# Patient Record
Sex: Female | Born: 1935 | Race: White | Hispanic: No | Smoking: Never smoker
Health system: Southern US, Community
[De-identification: ages and names within clinical notes are randomized; demographics above are authoritative.]

## PROBLEM LIST (undated history)

## (undated) DIAGNOSIS — F329 Major depressive disorder, single episode, unspecified: Secondary | ICD-10-CM

## (undated) DIAGNOSIS — G8929 Other chronic pain: Secondary | ICD-10-CM

## (undated) DIAGNOSIS — F419 Anxiety disorder, unspecified: Secondary | ICD-10-CM

## (undated) DIAGNOSIS — I10 Essential (primary) hypertension: Secondary | ICD-10-CM

## (undated) DIAGNOSIS — Z8719 Personal history of other diseases of the digestive system: Secondary | ICD-10-CM

## (undated) DIAGNOSIS — K219 Gastro-esophageal reflux disease without esophagitis: Secondary | ICD-10-CM

## (undated) DIAGNOSIS — J449 Chronic obstructive pulmonary disease, unspecified: Secondary | ICD-10-CM

## (undated) DIAGNOSIS — L03116 Cellulitis of left lower limb: Secondary | ICD-10-CM

## (undated) DIAGNOSIS — F32A Depression, unspecified: Secondary | ICD-10-CM

## (undated) DIAGNOSIS — M8080XA Other osteoporosis with current pathological fracture, unspecified site, initial encounter for fracture: Secondary | ICD-10-CM

## (undated) DIAGNOSIS — F0390 Unspecified dementia without behavioral disturbance: Secondary | ICD-10-CM

## (undated) DIAGNOSIS — M1990 Unspecified osteoarthritis, unspecified site: Secondary | ICD-10-CM

## (undated) HISTORY — DX: Unspecified osteoarthritis, unspecified site: M19.90

## (undated) HISTORY — DX: Major depressive disorder, single episode, unspecified: F32.9

## (undated) HISTORY — DX: Essential (primary) hypertension: I10

## (undated) HISTORY — DX: Other chronic pain: G89.29

## (undated) HISTORY — PX: CHOLECYSTECTOMY: SHX55

## (undated) HISTORY — DX: Anxiety disorder, unspecified: F41.9

## (undated) HISTORY — DX: Chronic obstructive pulmonary disease, unspecified: J44.9

## (undated) HISTORY — DX: Depression, unspecified: F32.A

## (undated) HISTORY — DX: Gastro-esophageal reflux disease without esophagitis: K21.9

## (undated) HISTORY — DX: Personal history of other diseases of the digestive system: Z87.19

## (undated) HISTORY — DX: Cellulitis of left lower limb: L03.116

---

## 2001-12-16 ENCOUNTER — Encounter: Payer: Self-pay | Admitting: Family Medicine

## 2001-12-16 ENCOUNTER — Ambulatory Visit (HOSPITAL_COMMUNITY): Admission: RE | Admit: 2001-12-16 | Discharge: 2001-12-16 | Payer: Self-pay | Admitting: Family Medicine

## 2002-12-19 ENCOUNTER — Ambulatory Visit (HOSPITAL_COMMUNITY): Admission: RE | Admit: 2002-12-19 | Discharge: 2002-12-19 | Payer: Self-pay | Admitting: Family Medicine

## 2002-12-19 ENCOUNTER — Encounter: Payer: Self-pay | Admitting: Family Medicine

## 2003-09-05 ENCOUNTER — Ambulatory Visit (HOSPITAL_COMMUNITY): Admission: RE | Admit: 2003-09-05 | Discharge: 2003-09-05 | Payer: Self-pay | Admitting: Ophthalmology

## 2004-01-22 ENCOUNTER — Ambulatory Visit (HOSPITAL_COMMUNITY): Admission: RE | Admit: 2004-01-22 | Discharge: 2004-01-22 | Payer: Self-pay | Admitting: Family Medicine

## 2004-10-23 ENCOUNTER — Ambulatory Visit (HOSPITAL_COMMUNITY): Admission: RE | Admit: 2004-10-23 | Discharge: 2004-10-23 | Payer: Self-pay | Admitting: Family Medicine

## 2005-01-27 ENCOUNTER — Ambulatory Visit (HOSPITAL_COMMUNITY): Admission: RE | Admit: 2005-01-27 | Discharge: 2005-01-27 | Payer: Self-pay | Admitting: Family Medicine

## 2006-01-29 ENCOUNTER — Ambulatory Visit (HOSPITAL_COMMUNITY): Admission: RE | Admit: 2006-01-29 | Discharge: 2006-01-29 | Payer: Self-pay | Admitting: Family Medicine

## 2006-03-13 ENCOUNTER — Emergency Department (HOSPITAL_COMMUNITY): Admission: EM | Admit: 2006-03-13 | Discharge: 2006-03-13 | Payer: Self-pay | Admitting: Emergency Medicine

## 2006-03-16 ENCOUNTER — Emergency Department (HOSPITAL_COMMUNITY): Admission: EM | Admit: 2006-03-16 | Discharge: 2006-03-16 | Payer: Self-pay | Admitting: *Deleted

## 2007-02-01 ENCOUNTER — Ambulatory Visit (HOSPITAL_COMMUNITY): Admission: RE | Admit: 2007-02-01 | Discharge: 2007-02-01 | Payer: Self-pay | Admitting: Family Medicine

## 2007-05-14 ENCOUNTER — Ambulatory Visit (HOSPITAL_COMMUNITY): Admission: RE | Admit: 2007-05-14 | Discharge: 2007-05-14 | Payer: Self-pay | Admitting: Family Medicine

## 2008-01-02 ENCOUNTER — Emergency Department (HOSPITAL_COMMUNITY): Admission: EM | Admit: 2008-01-02 | Discharge: 2008-01-02 | Payer: Self-pay | Admitting: Emergency Medicine

## 2008-02-02 ENCOUNTER — Ambulatory Visit (HOSPITAL_COMMUNITY): Admission: RE | Admit: 2008-02-02 | Discharge: 2008-02-02 | Payer: Self-pay | Admitting: Family Medicine

## 2008-02-14 ENCOUNTER — Ambulatory Visit (HOSPITAL_COMMUNITY): Admission: RE | Admit: 2008-02-14 | Discharge: 2008-02-14 | Payer: Self-pay | Admitting: Family Medicine

## 2008-04-11 ENCOUNTER — Ambulatory Visit (HOSPITAL_COMMUNITY): Admission: RE | Admit: 2008-04-11 | Discharge: 2008-04-11 | Payer: Self-pay | Admitting: Ophthalmology

## 2008-05-30 ENCOUNTER — Ambulatory Visit (HOSPITAL_COMMUNITY): Admission: RE | Admit: 2008-05-30 | Discharge: 2008-05-30 | Payer: Self-pay | Admitting: Family Medicine

## 2008-12-11 ENCOUNTER — Ambulatory Visit (HOSPITAL_COMMUNITY): Admission: RE | Admit: 2008-12-11 | Discharge: 2008-12-11 | Payer: Self-pay | Admitting: Family Medicine

## 2009-06-30 DIAGNOSIS — Z8719 Personal history of other diseases of the digestive system: Secondary | ICD-10-CM

## 2009-06-30 HISTORY — DX: Personal history of other diseases of the digestive system: Z87.19

## 2009-11-19 ENCOUNTER — Ambulatory Visit (HOSPITAL_COMMUNITY): Admission: RE | Admit: 2009-11-19 | Discharge: 2009-11-19 | Payer: Self-pay | Admitting: Family Medicine

## 2009-12-11 ENCOUNTER — Emergency Department (HOSPITAL_COMMUNITY): Admission: EM | Admit: 2009-12-11 | Discharge: 2009-12-11 | Payer: Self-pay | Admitting: Emergency Medicine

## 2010-01-02 ENCOUNTER — Ambulatory Visit: Payer: Self-pay | Admitting: Internal Medicine

## 2010-01-02 DIAGNOSIS — D509 Iron deficiency anemia, unspecified: Secondary | ICD-10-CM | POA: Insufficient documentation

## 2010-01-03 ENCOUNTER — Encounter: Payer: Self-pay | Admitting: Internal Medicine

## 2010-01-07 ENCOUNTER — Encounter: Payer: Self-pay | Admitting: Gastroenterology

## 2010-01-23 ENCOUNTER — Encounter: Payer: Self-pay | Admitting: Internal Medicine

## 2010-01-23 ENCOUNTER — Emergency Department (HOSPITAL_COMMUNITY): Admission: EM | Admit: 2010-01-23 | Discharge: 2010-01-23 | Payer: Self-pay | Admitting: Emergency Medicine

## 2010-02-28 ENCOUNTER — Inpatient Hospital Stay (HOSPITAL_COMMUNITY): Admission: EM | Admit: 2010-02-28 | Discharge: 2010-03-08 | Payer: Self-pay | Admitting: Emergency Medicine

## 2010-03-01 ENCOUNTER — Ambulatory Visit: Payer: Self-pay | Admitting: Internal Medicine

## 2010-03-05 ENCOUNTER — Ambulatory Visit: Payer: Self-pay | Admitting: Gastroenterology

## 2010-03-05 ENCOUNTER — Encounter: Payer: Self-pay | Admitting: Internal Medicine

## 2010-03-06 ENCOUNTER — Ambulatory Visit: Payer: Self-pay | Admitting: Internal Medicine

## 2010-03-07 ENCOUNTER — Encounter (INDEPENDENT_AMBULATORY_CARE_PROVIDER_SITE_OTHER): Payer: Self-pay | Admitting: Family Medicine

## 2010-03-07 ENCOUNTER — Ambulatory Visit: Payer: Self-pay | Admitting: Internal Medicine

## 2010-03-12 ENCOUNTER — Encounter: Payer: Self-pay | Admitting: Internal Medicine

## 2010-07-22 ENCOUNTER — Encounter: Payer: Self-pay | Admitting: Family Medicine

## 2010-07-30 NOTE — Assessment & Plan Note (Signed)
Summary: IDA,CONSULT FOR EGD ASAP PER PCP/SS   Visit Type:  consult Referring Provider:  Sudie Bailey Primary Care Provider:  Sudie Bailey  Chief Complaint:  IDA/EGD/TCS.  History of Present Illness: Ms. Lisa Watts is a pleasant 77 y/o WF, patient of Dr. Sudie Bailey, who presents today for further evaluation of IDA. She is really not sure why she is here. Upon further questioning, it appears she does have some issues with memory loss. She denies this. History questionably unreliable. She denies constipation, diarrhea, melena, brbpr. Eats lots of veggies. No abd pain. No hearturn, n/v. No dysphagia. Trying to loose weight. Walks two miles per daily. Never had TCS.  Labs from Dr. Osborne Casco, 11/28/09-->iron 23, sat 6%, ferritin 22.   Current Medications (verified): 1)  Multivitamins  Tabs (Multiple Vitamin) .... Once Daily  Allergies (verified): No Known Drug Allergies  Past History:  Past Medical History: Varicose veins, c/o massive hemorrhage requiring hospitalization in 5/11.  B12 deficiency, recent diagnosis, treated by Dr. Benita Stabile, recent diagnosis  Past Surgical History: Hysterectomy C-section X2 Appendectomy Cholecystectomy  Family History: No FH of CRC, liver, chronic GI illnesses.  Social History: One daughter. Son killed in [place]. Lives in [place]. Widowed. No tob. No alcohol.   Review of Systems General:  Denies fever, chills, sweats, anorexia, fatigue, weakness, and weight loss. Eyes:  Denies vision loss. ENT:  Denies nasal congestion, sore throat, hoarseness, and difficulty swallowing. CV:  Denies chest pains, angina, palpitations, dyspnea on exertion, and peripheral edema. Resp:  Denies dyspnea at rest, dyspnea with exercise, cough, sputum, and wheezing. GI:  See HPI. GU:  Denies urinary burning and blood in urine. MS:  Denies joint pain / LOM. Derm:  Denies rash and itching. Neuro:  Complains of abnormal sensation; denies frequent headaches, memory loss, and  confusion; cannot feel great toes. Psych:  Denies depression and anxiety. Endo:  Denies unusual weight change. Heme:  Denies bruising and bleeding. Allergy:  Denies hives and rash.  Vital Signs:  Patient profile:   77 year old female Height:      62.5 inches Weight:      121 pounds BMI:     21.86 Temp:     98.0 degrees F oral Pulse rate:   76 / minute BP sitting:   132 / 84  (left arm) Cuff size:   regular  Vitals Entered By: Hendricks Limes LPN (January 02, 1609 11:18 AM)  Physical Exam  General:  Well developed, well nourished, no acute distress. Head:  Normocephalic and atraumatic. Eyes:  sclera nonicteric Mouth:  dentures:.  OP moist. Neck:  Supple; no masses or thyromegaly. Lungs:  Clear throughout to auscultation. Heart:  Regular rate and rhythm; no murmurs, rubs,  or bruits. Abdomen:  Bowel sounds normal.  Abdomen is soft, nontender, nondistended.  No rebound or guarding.  No hepatosplenomegaly, masses or hernias.  No abdominal bruits.  Rectal:  deferred until time of colonoscopy.   Extremities:  No clubbing, cyanosis, edema or deformities noted. Neurologic:  Alert and  oriented x4;  grossly normal neurologically. ?memory issues Skin:  Intact without significant lesions or rashes. Cervical Nodes:  No significant cervical adenopathy. Psych:  Alert and cooperative. Normal mood and affect.  Impression & Recommendations:  Problem # 1:  ANEMIA, IRON DEFICIENCY (ICD-280.9)  IDA. Hemoccult status unknown. Patient denies GI symptoms. No prior TCS. Recommend TCS +/- EGD in near future. Colonoscopy+/-EGD to be performed in near future.  Risks, alternatives, and benefits including but not limited to the risk of  reaction to medication, bleeding, infection, and perforation were addressed.  Patient voiced understanding and provided verbal consent.   Obtain ifobt to help determine need for EGD.  Orders: Consultation Level III ([address]) I would like to thank Dr. Sudie Bailey for allowing [place]  to take part in the care of this nice patient.  Appended Document: IDA,CONSULT FOR EGD ASAP PER PCP/SS Need ifobt done ASAP. Need results prior to procedure on 7/22 if possible.  Appended Document: IDA,CONSULT FOR EGD ASAP PER PCP/SS tried to call pt- #busy  Appended Document: IDA,CONSULT FOR EGD ASAP PER PCP/SS spoke with pt- she doesnt remember if she did stool sample or not. she is going to call Bonita Quin ( who came with her to ov) and see if she has the container and will call back.

## 2010-07-30 NOTE — Letter (Signed)
Summary: tcs order  tcs order   Imported By: Hendricks Limes LPN 62/13/0865 78:46:96  _____________________________________________________________________  External Attachment:    Type:   Image     Comment:   External Document

## 2010-07-30 NOTE — Letter (Signed)
Summary: tcs/egd order  tcs/egd order   Imported By: Hendricks Limes LPN 16/03/9603 54:09:81  _____________________________________________________________________  External Attachment:    Type:   Image     Comment:   External Document

## 2010-07-30 NOTE — Letter (Signed)
Summary: referral from dr Sudie Bailey  referral from dr Sudie Bailey   Imported By: Rosine Beat 01/07/2010 15:21:45  _____________________________________________________________________  External Attachment:    Type:   Image     Comment:   External Document

## 2010-07-30 NOTE — Letter (Signed)
Summary: Patient Notice, Colon Biopsy Results  [facility]  [address]   [place], [place] [address]   Phone: [phone]  Fax: [phone]       March 12, 2010   HANIA CERONE [address] [place], [place]  [address] Nov 21, 1924    Dear Ms. Burrows,  I am pleased to inform you that the biopsies taken during your recent colonoscopy did not show any evidence of cancer upon pathologic examination.  There was inflammation suggestive of resolving colitis  Additional information/recommendations:   Continue with the treatment plan as outlined on the day of your exam.  Please call us if you are having persistent problems or have questions about your condition that have not been fully answered at this time.  Sincerely,    R. Roetta Sessions MD, FACP [facility] Ph: [phone]    Fax: [phone]   Appended Document: Patient Notice, Colon Biopsy Results mailed letter to pt

## 2010-07-30 NOTE — Letter (Signed)
Summary: HISTORY & PHYSICAL  HISTORY & PHYSICAL   Imported By: Rexene Alberts 03/05/2010 11:03:57  _____________________________________________________________________  External Attachment:    Type:   Image     Comment:   External Document

## 2010-09-12 LAB — BASIC METABOLIC PANEL
BUN: 31 mg/dL — ABNORMAL HIGH (ref 6–23)
CO2: 24 mEq/L (ref 19–32)
CO2: 25 mEq/L (ref 19–32)
CO2: 25 mEq/L (ref 19–32)
Calcium: 7.3 mg/dL — ABNORMAL LOW (ref 8.4–10.5)
Calcium: 7.6 mg/dL — ABNORMAL LOW (ref 8.4–10.5)
Calcium: 7.6 mg/dL — ABNORMAL LOW (ref 8.4–10.5)
Calcium: 7.9 mg/dL — ABNORMAL LOW (ref 8.4–10.5)
Calcium: 8.3 mg/dL — ABNORMAL LOW (ref 8.4–10.5)
Calcium: 8.6 mg/dL (ref 8.4–10.5)
Creatinine, Ser: 0.55 mg/dL (ref 0.4–1.2)
Creatinine, Ser: 0.57 mg/dL (ref 0.4–1.2)
Creatinine, Ser: 0.6 mg/dL (ref 0.4–1.2)
Creatinine, Ser: 0.68 mg/dL (ref 0.4–1.2)
Creatinine, Ser: 1.97 mg/dL — ABNORMAL HIGH (ref 0.4–1.2)
GFR calc Af Amer: 59 mL/min — ABNORMAL LOW (ref >60)
GFR calc Af Amer: >60 mL/min (ref >60)
GFR calc Af Amer: >60 mL/min (ref >60)
GFR calc Af Amer: >60 mL/min (ref >60)
GFR calc Af Amer: >60 mL/min (ref >60)
GFR calc non Af Amer: 48 mL/min — ABNORMAL LOW (ref >60)
GFR calc non Af Amer: >60 mL/min (ref >60)
GFR calc non Af Amer: >60 mL/min (ref >60)
Glucose, Bld: 141 mg/dL — ABNORMAL HIGH (ref 70–99)
Glucose, Bld: 95 mg/dL (ref 70–99)
Potassium: 4 mEq/L (ref 3.5–5.1)
Sodium: 141 mEq/L (ref 135–145)
Sodium: 142 mEq/L (ref 135–145)

## 2010-09-12 LAB — CK
Total CK: 1514 U/L — ABNORMAL HIGH (ref 7–177)
Total CK: 1736 U/L — ABNORMAL HIGH (ref 7–177)
Total CK: 694 U/L — ABNORMAL HIGH (ref 7–177)

## 2010-09-12 LAB — CBC
HCT: 27.6 % — ABNORMAL LOW (ref 36.0–46.0)
Hemoglobin: 8.9 g/dL — ABNORMAL LOW (ref 12.0–15.0)
MCH: 30.4 pg (ref 26.0–34.0)
MCH: 30.6 pg (ref 26.0–34.0)
MCH: 30.6 pg (ref 26.0–34.0)
MCH: 31.2 pg (ref 26.0–34.0)
MCH: 31.2 pg (ref 26.0–34.0)
MCHC: 33.8 g/dL (ref 30.0–36.0)
MCHC: 33.8 g/dL (ref 30.0–36.0)
MCHC: 33.8 g/dL (ref 30.0–36.0)
MCV: 92.1 fL (ref 78.0–100.0)
Platelets: 132 10*3/uL — ABNORMAL LOW (ref 150–400)
Platelets: 145 10*3/uL — ABNORMAL LOW (ref 150–400)
Platelets: 153 10*3/uL (ref 150–400)
Platelets: 183 10*3/uL (ref 150–400)
Platelets: 198 10*3/uL (ref 150–400)
Platelets: 225 10*3/uL (ref 150–400)
RBC: 2.43 MIL/uL — ABNORMAL LOW (ref 3.87–5.11)
RBC: 2.88 MIL/uL — ABNORMAL LOW (ref 3.87–5.11)
RBC: 3 MIL/uL — ABNORMAL LOW (ref 3.87–5.11)
RBC: 3.13 MIL/uL — ABNORMAL LOW (ref 3.87–5.11)
RDW: 14.1 % (ref 11.5–15.5)
RDW: 14.2 % (ref 11.5–15.5)
RDW: 15.3 % (ref 11.5–15.5)
WBC: 12.3 10*3/uL — ABNORMAL HIGH (ref 4.0–10.5)
WBC: 9.2 10*3/uL (ref 4.0–10.5)

## 2010-09-12 LAB — CK TOTAL AND CKMB (NOT AT ARMC)
CK, MB: 12.6 ng/mL (ref 0.3–4.0)
Relative Index: 0.3 (ref 0.0–2.5)
Relative Index: 1.9 (ref 0.0–2.5)
Total CK: 4452 U/L — ABNORMAL HIGH (ref 7–177)

## 2010-09-12 LAB — DIFFERENTIAL
Basophils Absolute: 0 10*3/uL (ref 0.0–0.1)
Basophils Absolute: 0 10*3/uL (ref 0.0–0.1)
Basophils Absolute: 0 10*3/uL (ref 0.0–0.1)
Basophils Absolute: 0.1 10*3/uL (ref 0.0–0.1)
Basophils Absolute: 0.1 10*3/uL (ref 0.0–0.1)
Basophils Relative: 0 % (ref 0–1)
Basophils Relative: 0 % (ref 0–1)
Basophils Relative: 0 % (ref 0–1)
Basophils Relative: 1 % (ref 0–1)
Eosinophils Absolute: 0 10*3/uL (ref 0.0–0.7)
Eosinophils Absolute: 0 10*3/uL (ref 0.0–0.7)
Eosinophils Absolute: 0.2 10*3/uL (ref 0.0–0.7)
Eosinophils Absolute: 0.3 10*3/uL (ref 0.0–0.7)
Eosinophils Absolute: 0.3 10*3/uL (ref 0.0–0.7)
Eosinophils Relative: 2 % (ref 0–5)
Eosinophils Relative: 3 % (ref 0–5)
Eosinophils Relative: 4 % (ref 0–5)
Lymphocytes Relative: 17 % (ref 12–46)
Lymphs Abs: 1.4 10*3/uL (ref 0.7–4.0)
Lymphs Abs: 1.7 10*3/uL (ref 0.7–4.0)
Monocytes Relative: 5 % (ref 3–12)
Monocytes Relative: 7 % (ref 3–12)
Neutro Abs: 9.2 10*3/uL — ABNORMAL HIGH (ref 1.7–7.7)
Neutrophils Relative %: 75 % (ref 43–77)
Neutrophils Relative %: 81 % — ABNORMAL HIGH (ref 43–77)

## 2010-09-12 LAB — CULTURE, BLOOD (ROUTINE X 2)
Report Status: 9062011
Report Status: 9062011

## 2010-09-12 LAB — MYOGLOBIN, SERUM: Myoglobin: 827 ng/mL — ABNORMAL HIGH (ref <111)

## 2010-09-12 LAB — CARDIAC PANEL(CRET KIN+CKTOT+MB+TROPI)
CK, MB: 136.7 ng/mL (ref 0.3–4.0)
CK, MB: 64.1 ng/mL (ref 0.3–4.0)
Relative Index: 0.6 (ref 0.0–2.5)
Total CK: 2388 U/L — ABNORMAL HIGH (ref 7–177)
Total CK: 4332 U/L — ABNORMAL HIGH (ref 7–177)
Total CK: 6948 U/L — ABNORMAL HIGH (ref 7–177)
Total CK: 9591 U/L — ABNORMAL HIGH (ref 7–177)
Troponin I: 0.21 ng/mL — ABNORMAL HIGH (ref 0.00–0.06)

## 2010-09-12 LAB — URINALYSIS, ROUTINE W REFLEX MICROSCOPIC
Leukocytes, UA: NEGATIVE
Nitrite: POSITIVE — AB
Specific Gravity, Urine: >1.03 — ABNORMAL HIGH (ref 1.005–1.030)
Urobilinogen, UA: 0.2 mg/dL (ref 0.0–1.0)

## 2010-09-12 LAB — CROSSMATCH: Antibody Screen: NEGATIVE

## 2010-09-12 LAB — FERRITIN: Ferritin: 55 ng/mL (ref 10–291)

## 2010-09-12 LAB — URINE CULTURE

## 2010-09-12 LAB — URINE MICROSCOPIC-ADD ON

## 2010-09-12 LAB — RETICULOCYTES
RBC.: 2.14 MIL/uL — ABNORMAL LOW (ref 3.87–5.11)
Retic Count, Absolute: 44.9 10*3/uL (ref 19.0–186.0)

## 2010-09-12 LAB — POCT CARDIAC MARKERS
CKMB, poc: >80 ng/mL (ref 1.0–8.0)
Troponin i, poc: 0.08 ng/mL (ref 0.00–0.09)

## 2010-09-12 LAB — LACTIC ACID, PLASMA: Lactic Acid, Venous: 4.4 mmol/L — ABNORMAL HIGH (ref 0.5–2.2)

## 2010-09-12 LAB — IRON AND TIBC: Iron: 30 ug/dL — ABNORMAL LOW (ref 42–135)

## 2010-09-12 LAB — VITAMIN B12: Vitamin B-12: 367 pg/mL (ref 211–911)

## 2010-09-12 LAB — GLUCOSE, CAPILLARY: Glucose-Capillary: 146 mg/dL — ABNORMAL HIGH (ref 70–99)

## 2010-09-14 LAB — POCT I-STAT, CHEM 8
Calcium, Ion: 1.14 mmol/L (ref 1.12–1.32)
Creatinine, Ser: 0.8 mg/dL (ref 0.4–1.2)
Glucose, Bld: 99 mg/dL (ref 70–99)
Hemoglobin: 12.2 g/dL (ref 12.0–15.0)
Sodium: 140 mEq/L (ref 135–145)
TCO2: 28 mmol/L (ref 0–100)

## 2010-09-14 LAB — POCT CARDIAC MARKERS
CKMB, poc: <1 ng/mL — ABNORMAL LOW (ref 1.0–8.0)
Myoglobin, poc: 65.8 ng/mL (ref 12–200)
Troponin i, poc: <0.05 ng/mL (ref 0.00–0.09)

## 2010-09-16 LAB — DIFFERENTIAL
Basophils Absolute: 0 10*3/uL (ref 0.0–0.1)
Basophils Relative: 1 % (ref 0–1)
Eosinophils Relative: 3 % (ref 0–5)
Lymphocytes Relative: 25 % (ref 12–46)
Neutro Abs: 4.3 10*3/uL (ref 1.7–7.7)

## 2010-09-16 LAB — COMPREHENSIVE METABOLIC PANEL
AST: 28 U/L (ref 0–37)
Alkaline Phosphatase: 21 U/L — ABNORMAL LOW (ref 39–117)
BUN: 3 mg/dL — ABNORMAL LOW (ref 6–23)
CO2: 30 mEq/L (ref 19–32)
Chloride: 95 mEq/L — ABNORMAL LOW (ref 96–112)
Creatinine, Ser: 0.59 mg/dL (ref 0.4–1.2)
GFR calc non Af Amer: >60 mL/min (ref >60)
Potassium: 3.7 mEq/L (ref 3.5–5.1)
Total Bilirubin: 0.4 mg/dL (ref 0.3–1.2)

## 2010-09-16 LAB — URINALYSIS, ROUTINE W REFLEX MICROSCOPIC
Glucose, UA: NEGATIVE mg/dL
Hgb urine dipstick: NEGATIVE
Specific Gravity, Urine: 1.01 (ref 1.005–1.030)
pH: 7.5 (ref 5.0–8.0)

## 2010-09-16 LAB — CBC
HCT: 31.8 % — ABNORMAL LOW (ref 36.0–46.0)
MCV: 94.5 fL (ref 78.0–100.0)
RBC: 3.37 MIL/uL — ABNORMAL LOW (ref 3.87–5.11)
WBC: 6.6 10*3/uL (ref 4.0–10.5)

## 2012-05-26 IMAGING — CT CT HEAD W/O CM
3 of 4 series · 16 of 30 positions shown, 18 images · non-contrast
Comparison: 03/16/2006 head CT

CT HEAD

CLINICAL DATA: Fall, laceration to the left side of the head

CT HEAD WITHOUT CONTRAST
CT CERVICAL SPINE WITHOUT CONTRAST
TECHNIQUE: Multidetector CT imaging of the head and cervical spine
was performed following the standard protocol without intravenous
contrast.  Multiplanar CT image reconstructions of the cervical
spine were also generated.

[Series 3: headseq 2.4 h60s · axial · 0.47mm/px · z∈[+997,+1097]mm · 5 of 60 slices shown, 7 images]
[im 10/60  brain]
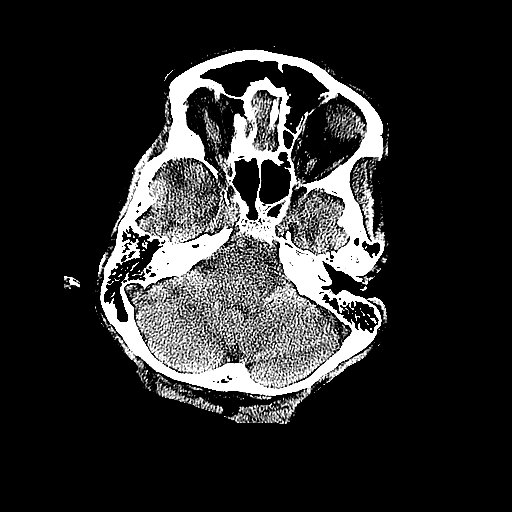
[im 10/60  bone]
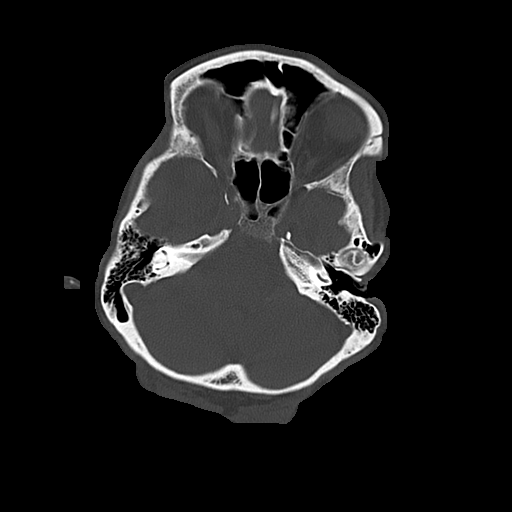
[im 20/60  brain]
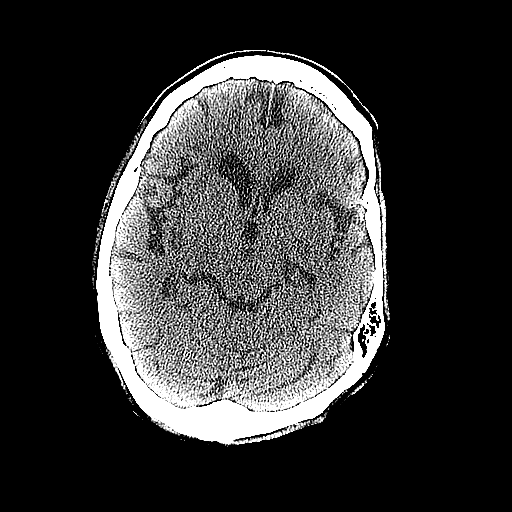
[im 30/60  brain]
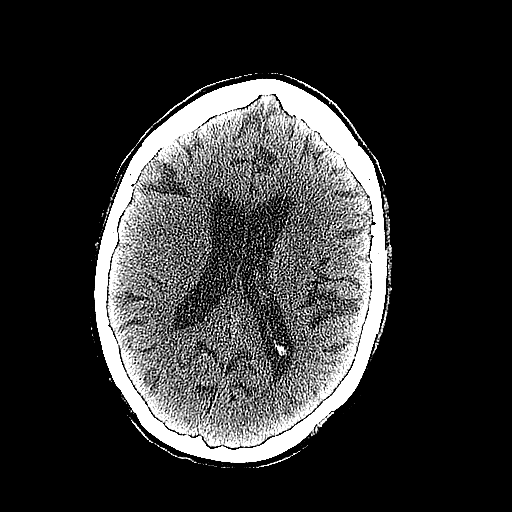
[im 40/60  brain]
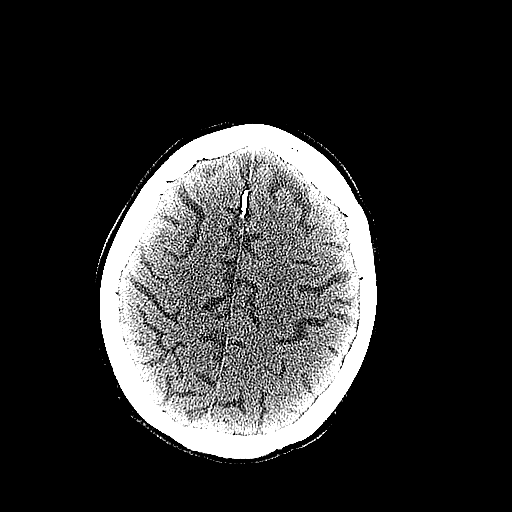
[im 50/60  brain]
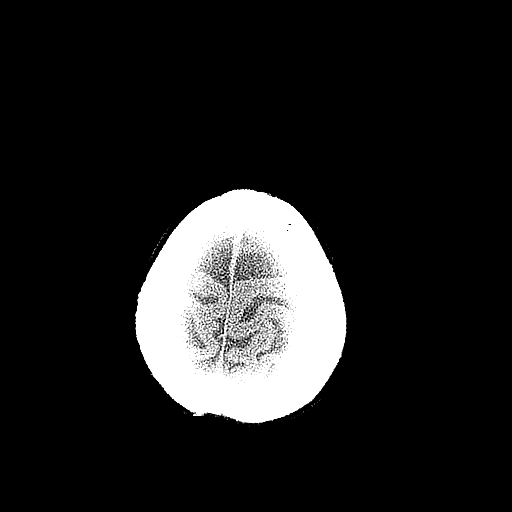
[im 50/60  bone]
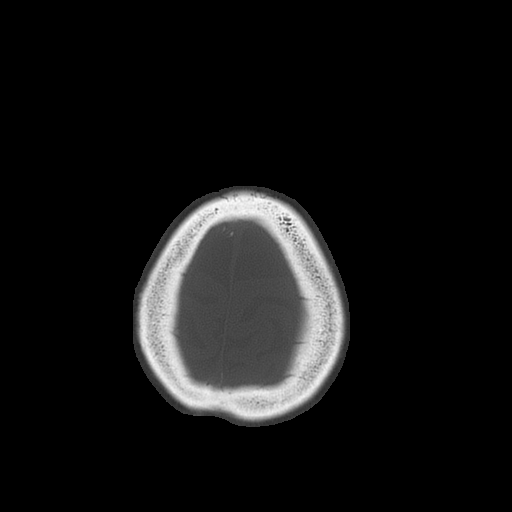

[Series 5: cervical st 2.0 b31s · axial · 0.31mm/px · z∈[+820,+856]mm · 3 of 84 slices shown]
[im 10/84  brain]
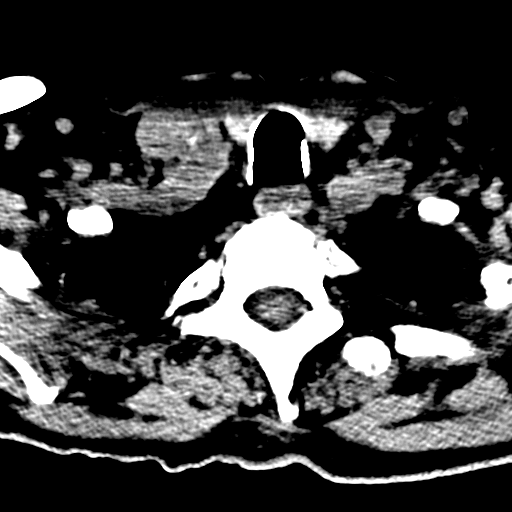
[im 19/84  brain]
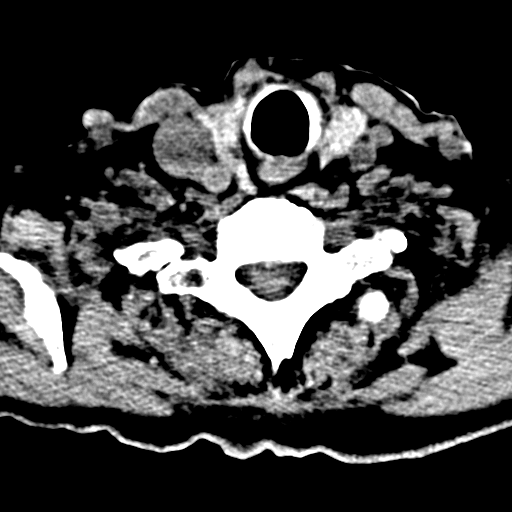
[im 28/84  brain]
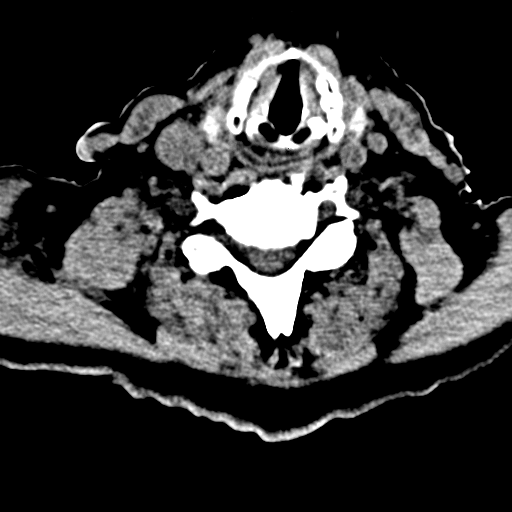

[Series 8: cervical axial (id) · axial · 0.16mm/px · z∈[+811,+936]mm · 8 of 84 slices shown]
[im 10/84  brain]
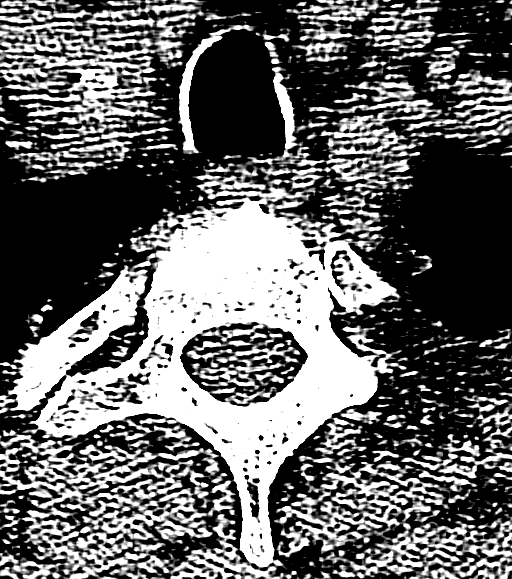
[im 19/84  brain]
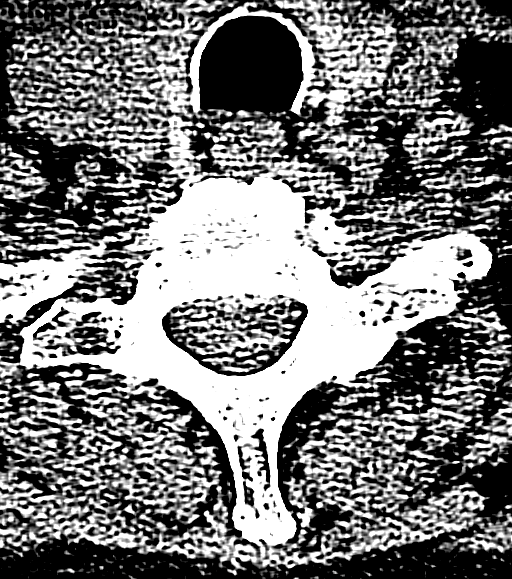
[im 28/84  brain]
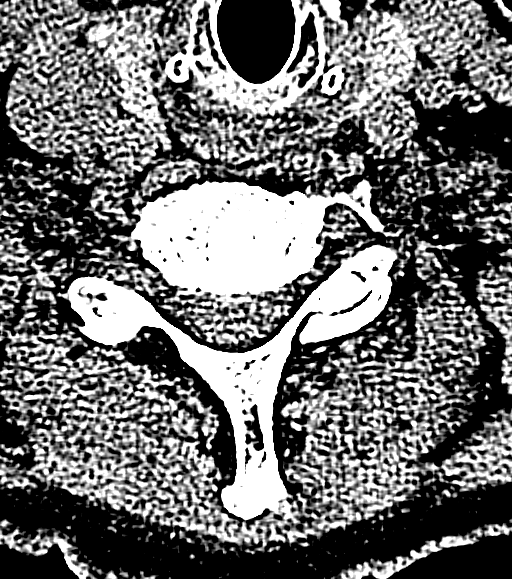
[im 37/84  brain]
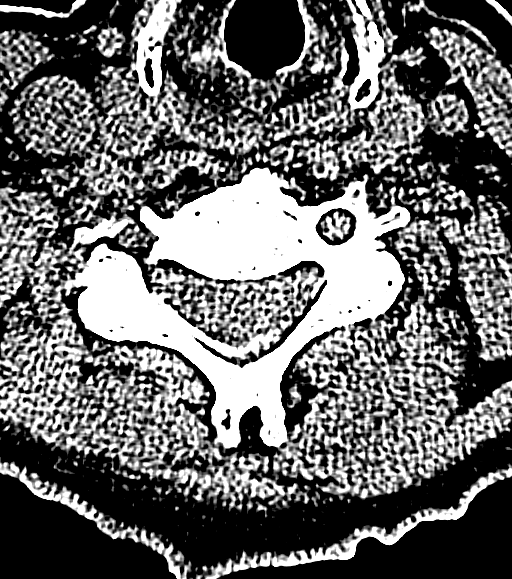
[im 47/84  brain]
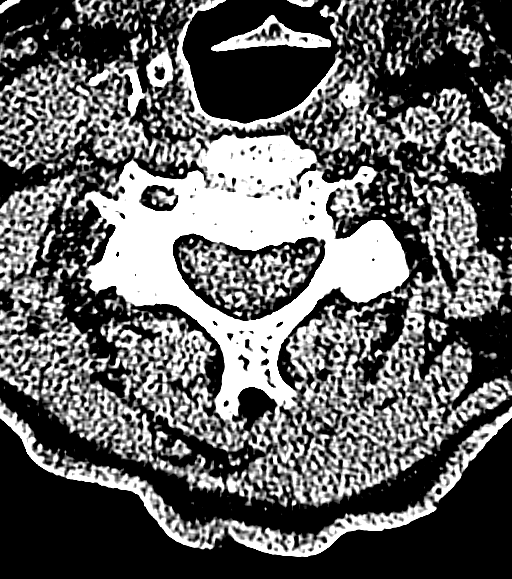
[im 56/84  brain]
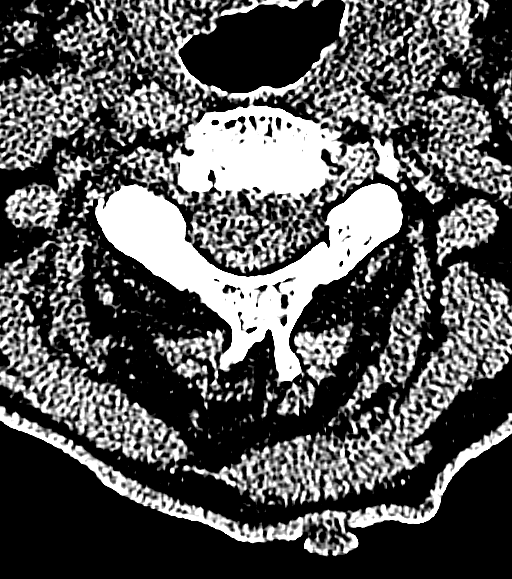
[im 65/84  brain]
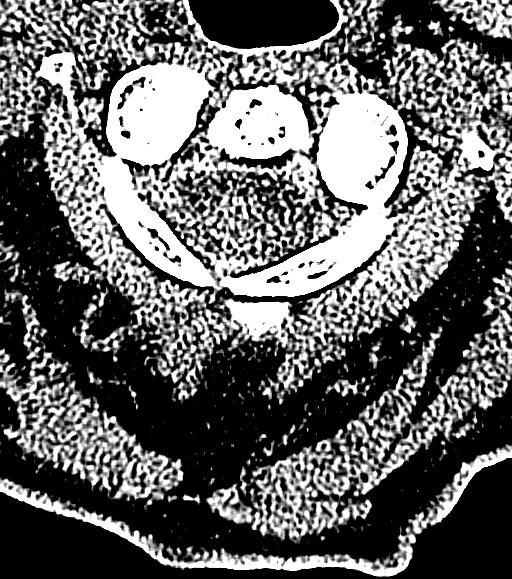
[im 74/84  brain]
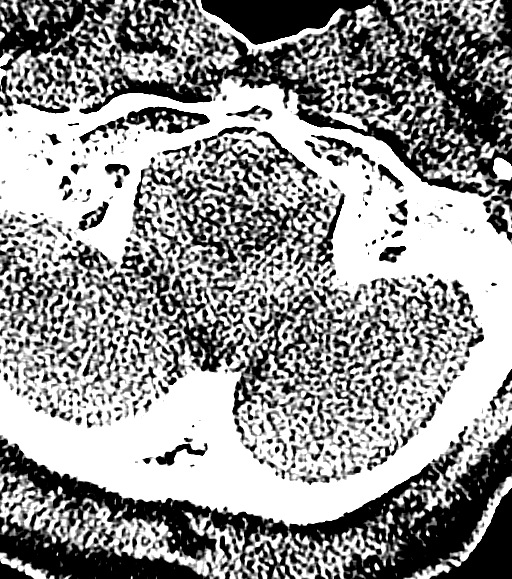

[16 of 30 positions shown; findings below may reference images not displayed]

FINDINGS: Bilateral internal capsule remote infarcts are stable.
Cortical volume loss noted with proportional ventricular
prominence.  Periventricular white matter hypodensity likely
indicates small vessel ischemic change.  No acute hemorrhage, acute
infarction, or mass lesion is identified. No skull fracture.  No
soft tissue abnormality.
IMPRESSION: On no acute intracranial finding.  No significant change.

CT CERVICAL SPINE
FINDINGS: C1 through the cervical thoracic junction is visualized
in its entirety. Reversal of the normal cervical lordosis is likely
due to degenerative change at the mid  cervical spine.  Disc
degenerative changes are noted most prominent at the C5-C6 and C6-
C7 levels.  No fracture.  Mild bilateral neural foraminal narrowing
is noted associated with proliferative posterior osteophytic change
predominately at C5-C6.  Multilevel facet osteophytic change
identified.  Minimal biapical pleural scarring is noted at the lung
apices.
IMPRESSION: No acute cervical spine abnormality.

## 2013-03-20 ENCOUNTER — Inpatient Hospital Stay (HOSPITAL_COMMUNITY)
Admission: EM | Admit: 2013-03-20 | Discharge: 2013-03-25 | DRG: 481 | Disposition: A | Discharge status: Skilled Nursing Facility | Payer: Medicare Other | Attending: Internal Medicine | Admitting: Internal Medicine

## 2013-03-20 ENCOUNTER — Emergency Department (HOSPITAL_COMMUNITY): Payer: Medicare Other

## 2013-03-20 ENCOUNTER — Encounter (HOSPITAL_COMMUNITY): Payer: Self-pay | Admitting: Emergency Medicine

## 2013-03-20 DIAGNOSIS — M810 Age-related osteoporosis without current pathological fracture: Secondary | ICD-10-CM | POA: Diagnosis present

## 2013-03-20 DIAGNOSIS — N179 Acute kidney failure, unspecified: Secondary | ICD-10-CM | POA: Diagnosis not present

## 2013-03-20 DIAGNOSIS — D62 Acute posthemorrhagic anemia: Secondary | ICD-10-CM | POA: Diagnosis not present

## 2013-03-20 DIAGNOSIS — I10 Essential (primary) hypertension: Secondary | ICD-10-CM | POA: Diagnosis present

## 2013-03-20 DIAGNOSIS — E785 Hyperlipidemia, unspecified: Secondary | ICD-10-CM | POA: Diagnosis present

## 2013-03-20 DIAGNOSIS — Y9210 Unspecified residential institution as the place of occurrence of the external cause: Secondary | ICD-10-CM | POA: Diagnosis present

## 2013-03-20 DIAGNOSIS — J449 Chronic obstructive pulmonary disease, unspecified: Secondary | ICD-10-CM | POA: Diagnosis present

## 2013-03-20 DIAGNOSIS — J4489 Other specified chronic obstructive pulmonary disease: Secondary | ICD-10-CM | POA: Diagnosis present

## 2013-03-20 DIAGNOSIS — W010XXA Fall on same level from slipping, tripping and stumbling without subsequent striking against object, initial encounter: Secondary | ICD-10-CM | POA: Diagnosis present

## 2013-03-20 DIAGNOSIS — S72002A Fracture of unspecified part of neck of left femur, initial encounter for closed fracture: Secondary | ICD-10-CM

## 2013-03-20 DIAGNOSIS — F0390 Unspecified dementia without behavioral disturbance: Secondary | ICD-10-CM | POA: Diagnosis present

## 2013-03-20 DIAGNOSIS — S7222XA Displaced subtrochanteric fracture of left femur, initial encounter for closed fracture: Secondary | ICD-10-CM

## 2013-03-20 DIAGNOSIS — Z22322 Carrier or suspected carrier of Methicillin resistant Staphylococcus aureus: Secondary | ICD-10-CM

## 2013-03-20 DIAGNOSIS — D509 Iron deficiency anemia, unspecified: Secondary | ICD-10-CM

## 2013-03-20 DIAGNOSIS — S7223XA Displaced subtrochanteric fracture of unspecified femur, initial encounter for closed fracture: Principal | ICD-10-CM | POA: Diagnosis present

## 2013-03-20 DIAGNOSIS — F05 Delirium due to known physiological condition: Secondary | ICD-10-CM | POA: Diagnosis present

## 2013-03-20 DIAGNOSIS — F0391 Unspecified dementia with behavioral disturbance: Secondary | ICD-10-CM

## 2013-03-20 HISTORY — DX: Other osteoporosis with current pathological fracture, unspecified site, initial encounter for fracture: M80.80XA

## 2013-03-20 HISTORY — DX: Unspecified dementia, unspecified severity, without behavioral disturbance, psychotic disturbance, mood disturbance, and anxiety: F03.90

## 2013-03-20 LAB — CBC WITH DIFFERENTIAL/PLATELET
Basophils Relative: 0 % (ref 0–1)
Eosinophils Relative: 0 % (ref 0–5)
Lymphocytes Relative: 9 % — ABNORMAL LOW (ref 12–46)
MCHC: 33.4 g/dL (ref 30.0–36.0)
Monocytes Absolute: 0.9 10*3/uL (ref 0.1–1.0)
Neutro Abs: 12.4 10*3/uL — ABNORMAL HIGH (ref 1.7–7.7)
Neutrophils Relative %: 85 % — ABNORMAL HIGH (ref 43–77)
Platelets: 207 10*3/uL (ref 150–400)
RDW: 13.2 % (ref 11.5–15.5)
WBC: 14.7 10*3/uL — ABNORMAL HIGH (ref 4.0–10.5)

## 2013-03-20 LAB — BASIC METABOLIC PANEL
BUN: 16 mg/dL (ref 6–23)
Calcium: 8.6 mg/dL (ref 8.4–10.5)
Chloride: 97 mEq/L (ref 96–112)
GFR calc non Af Amer: 79 mL/min — ABNORMAL LOW (ref >90)
Glucose, Bld: 137 mg/dL — ABNORMAL HIGH (ref 70–99)
Potassium: 3.9 mEq/L (ref 3.5–5.1)

## 2013-03-20 MED ORDER — HYDROCODONE-ACETAMINOPHEN 5-325 MG PO TABS
1.0000 | ORAL_TABLET | ORAL | Status: DC | PRN
  Administered 2013-03-21: 2 via ORAL
  Filled 2013-03-20: qty 2

## 2013-03-20 MED ORDER — MORPHINE SULFATE 2 MG/ML IJ SOLN
0.5000 mg | INTRAMUSCULAR | Status: DC | PRN
  Administered 2013-03-21: 0.5 mg via INTRAVENOUS
  Filled 2013-03-20: qty 1

## 2013-03-20 MED ORDER — ONDANSETRON HCL 4 MG/2ML IJ SOLN
4.0000 mg | Freq: Three times a day (TID) | INTRAMUSCULAR | Status: AC | PRN
  Administered 2013-03-20: 4 mg via INTRAVENOUS
  Filled 2013-03-20: qty 2

## 2013-03-20 MED ORDER — HYDROMORPHONE HCL PF 1 MG/ML IJ SOLN
1.0000 mg | Freq: Once | INTRAMUSCULAR | Status: AC
  Administered 2013-03-20: 1 mg via INTRAVENOUS
  Filled 2013-03-20: qty 1

## 2013-03-20 NOTE — ED Notes (Signed)
Attempts at foley unsuccessful.  Urinary meatus very small.  Will attempt to locate <28F foley catheter.

## 2013-03-20 NOTE — ED Notes (Signed)
Pt has been sleeping, pain tolerable.

## 2013-03-20 NOTE — Consult Note (Signed)
Reason for Consult:left hip pain Referring Physician: EDP  HPI: Lisa Watts is an 79 y.o. female resident of SNF with PMH significant for dementia, fell today with complaints of left hip pain and inability to bear weight.   Past Medical History  Diagnosis Date  . Hypertension     No past surgical history on file.  No family history on file.  Social History:  has no tobacco, alcohol, and drug history on file.  Allergies: No Known Allergies  Medications: med list pending  Results for orders placed during the hospital encounter of 03/20/13 (from the past 48 hour(s))  CBC WITH DIFFERENTIAL     Status: Abnormal   Collection Time    03/20/13  8:46 PM      Result Value Range   WBC 14.7 (*) 4.0 - 10.5 K/uL   RBC 3.77 (*) 3.87 - 5.11 MIL/uL   Hemoglobin 11.7 (*) 12.0 - 15.0 g/dL   HCT 40.9 (*) 81.1 - 91.4 %   MCV 92.8  78.0 - 100.0 fL   MCH 31.0  26.0 - 34.0 pg   MCHC 33.4  30.0 - 36.0 g/dL   RDW 78.2  95.6 - 21.3 %   Platelets 207  150 - 400 K/uL   Neutrophils Relative % 85 (*) 43 - 77 %   Neutro Abs 12.4 (*) 1.7 - 7.7 K/uL   Lymphocytes Relative 9 (*) 12 - 46 %   Lymphs Abs 1.3  0.7 - 4.0 K/uL   Monocytes Relative 6  3 - 12 %   Monocytes Absolute 0.9  0.1 - 1.0 K/uL   Eosinophils Relative 0  0 - 5 %   Eosinophils Absolute 0.0  0.0 - 0.7 K/uL   Basophils Relative 0  0 - 1 %   Basophils Absolute 0.0  0.0 - 0.1 K/uL  BASIC METABOLIC PANEL     Status: Abnormal   Collection Time    03/20/13  8:46 PM      Result Value Range   Sodium 135  135 - 145 mEq/L   Potassium 3.9  3.5 - 5.1 mEq/L   Chloride 97  96 - 112 mEq/L   CO2 29  19 - 32 mEq/L   Glucose, Bld 137 (*) 70 - 99 mg/dL   BUN 16  6 - 23 mg/dL   Creatinine, Ser 0.86  0.50 - 1.10 mg/dL   Calcium 8.6  8.4 - 57.8 mg/dL   GFR calc non Af Amer 79 (*) >90 mL/min   GFR calc Af Amer >90  >90 mL/min   Comment: (NOTE)     The eGFR has been calculated using the CKD EPI equation.     This calculation has not been validated  in all clinical situations.     eGFR's persistently <90 mL/min signify possible Chronic Kidney     Disease.  PROTIME-INR     Status: None   Collection Time    03/20/13  8:46 PM      Result Value Range   Prothrombin Time 14.8  11.6 - 15.2 seconds   INR 1.19  0.00 - 1.49    Dg Chest 1 View  03/20/2013   CLINICAL DATA:  Pain post trauma  EXAM: CHEST - 1 VIEW  COMPARISON:  March 04, 2010  FINDINGS: There is no edema or consolidation. Heart is upper normal in size with normal pulmonary vascularity. Aorta is prominent with atherosclerotic change, stable. No adenopathy. No pneumothorax. No bone lesions. There is upper thoracic  levoscoliosis and thoracolumbar dextroscoliosis.  IMPRESSION: No edema or consolidation. No pneumothorax.   Electronically Signed   By: Bretta Bang   On: 03/20/2013 20:51   Dg Pelvis 1-2 Views  03/20/2013   CLINICAL DATA:  Pain post trauma  EXAM: PELVIS - 1-2 VIEW  COMPARISON:  February 28, 2010  FINDINGS: There is a comminuted fracture of the proximal femur which arises in the subtrochanteric region. There is avulsion of the lesser trochanter with medial displacement of the lesser trochanter. No other fractures. No dislocation. There is mild symmetric narrowing of both hip joints. Bones are osteoporotic.  IMPRESSION: Left-sided subtrochanteric comminuted fracture. Avulsion of lesser trochanter.   Electronically Signed   By: Bretta Bang   On: 03/20/2013 20:55   Dg Femur Left  03/20/2013   CLINICAL DATA:  Status post fall with left leg pain.  EXAM: LEFT FEMUR - 2 VIEW  COMPARISON:  Pelvic radiographs 02/28/2010.  FINDINGS: There is an oblique, mildly comminuted and mildly displaced subtrochanteric fracture of the left femur. This demonstrates medial displacement. On the lateral view, there is moderate apex anterior angulation. There is no dislocation. The distal femur is suboptimally evaluated in the frontal examination due to the proximal injury.  IMPRESSION:  Angulated and mildly displaced subtrochanteric fracture of the left femur.   Electronically Signed   By: Roxy Horseman   On: 03/20/2013 20:57   Ct Head Wo Contrast  03/20/2013   CLINICAL DATA:  Extreme leg and left hip pain status post fall.  EXAM: CT HEAD WITHOUT CONTRAST  CT CERVICAL SPINE WITHOUT CONTRAST  TECHNIQUE: Multidetector CT imaging of the head and cervical spine was performed following the standard protocol without intravenous contrast. Multiplanar CT image reconstructions of the cervical spine were also generated.  COMPARISON:  Head CT 02/28/2010. Cervical spine CT 01/23/2010.  FINDINGS: CT HEAD FINDINGS  There is chronic atrophy and periventricular white matter disease. A small lacunar infarct in the right lentiform nucleus has developed in the interval. Chronic small vessel ischemic changes in the left thalamus are stable. There is no evidence of acute intracranial hemorrhage, mass lesion, brain edema or extra-axial fluid collection.  The visualized paranasal sinuses, mastoid air cells and middle ears are clear. The calvarium is intact.  CT CERVICAL SPINE FINDINGS  There is no evidence of acute cervical spine fracture, traumatic subluxation or focal paraspinal abnormality. Multilevel spondylosis is again noted with progressive generalized kyphosis. The C4-5 facet joints appear ankylosed. The lung apices are clear.  IMPRESSION: CT HEAD IMPRESSION  No acute intracranial or calvarial findings. Progressive chronic small vessel ischemic changes as described.  CT CERVICAL SPINE IMPRESSION  No evidence of acute cervical spine fracture, traumatic subluxation or static signs of instability. There is multilevel spondylosis with progressive reversal of the usual cervical lordosis.   Electronically Signed   By: Roxy Horseman   On: 03/20/2013 20:07   Ct Cervical Spine Wo Contrast  03/20/2013   CLINICAL DATA:  Extreme leg and left hip pain status post fall.  EXAM: CT HEAD WITHOUT CONTRAST  CT CERVICAL SPINE  WITHOUT CONTRAST  TECHNIQUE: Multidetector CT imaging of the head and cervical spine was performed following the standard protocol without intravenous contrast. Multiplanar CT image reconstructions of the cervical spine were also generated.  COMPARISON:  Head CT 02/28/2010. Cervical spine CT 01/23/2010.  FINDINGS: CT HEAD FINDINGS  There is chronic atrophy and periventricular white matter disease. A small lacunar infarct in the right lentiform nucleus has developed in the interval.  Chronic small vessel ischemic changes in the left thalamus are stable. There is no evidence of acute intracranial hemorrhage, mass lesion, brain edema or extra-axial fluid collection.  The visualized paranasal sinuses, mastoid air cells and middle ears are clear. The calvarium is intact.  CT CERVICAL SPINE FINDINGS  There is no evidence of acute cervical spine fracture, traumatic subluxation or focal paraspinal abnormality. Multilevel spondylosis is again noted with progressive generalized kyphosis. The C4-5 facet joints appear ankylosed. The lung apices are clear.  IMPRESSION: CT HEAD IMPRESSION  No acute intracranial or calvarial findings. Progressive chronic small vessel ischemic changes as described.  CT CERVICAL SPINE IMPRESSION  No evidence of acute cervical spine fracture, traumatic subluxation or static signs of instability. There is multilevel spondylosis with progressive reversal of the usual cervical lordosis.   Electronically Signed   By: Roxy Horseman   On: 03/20/2013 20:07     Vitals Temp:  [98.4 F (36.9 C)] 98.4 F (36.9 C) (09/21 1905) Pulse Rate:  [92-97] 97 (09/21 2211) Resp:  [18-22] 18 (09/21 2211) BP: (120-152)/(50-68) 120/52 mmHg (09/21 2211) SpO2:  [94 %-99 %] 99 % (09/21 2211) There is no weight on file to calculate BMI.  Physical Exam: pleasantly confused elderly female, not oriented, c/o left hip pain. [place]/AT, no clavicular tenderness or crepitance, UE's no gross bone or joint instability, good grip  strength, abdomen obese, non tender, RLE non tender and no gross bone or joint instability, Left hip held flexed with diffuse tenderness and marked pain with attempts at hip motion, grossly N/V intact BLE. Xray as noted above with L subtrochanteric femur fracture.     Assessment/Plan: Impression: Left subtrochanteric femur fracture Treatment: Will require surgical stabilization. I will discuss with my partners availability for surgical repair, and ultimately contact social services to obtain POA consent for surgery.  Alyn Riedinger M 03/20/2013, 11:00 PM

## 2013-03-20 NOTE — ED Notes (Signed)
Pt arrived from [facility] [place] assisted living with c/o left hip pain post fall. Shortening of left leg and left foot turned out. PMS intact. EMS administered Morphine 8mg  IV.

## 2013-03-20 NOTE — ED Notes (Signed)
Patient transported to CT 

## 2013-03-20 NOTE — ED Notes (Signed)
Lab at bedside to draw blood.

## 2013-03-20 NOTE — ED Notes (Signed)
Returned from [facility] [employer].  Pt pleasantly confused, asking about her dogs.  She was provided a stuffed puppet for her comfort.

## 2013-03-20 NOTE — ED Notes (Signed)
Rec'd call from Barnie Alderman, Social Services POA for patient and should be called for medical decisions and permission to treat.  She can be reached at the after hours call number [phone].  She will be OOT on 9/22 so we should call her supervisor Sloan Leiter at [phone].  If unable to reach her, contact Salissa at [phone], [phone].  Carrie Mew will be the on-call social worker who can be reached at [phone].

## 2013-03-20 NOTE — ED Provider Notes (Signed)
CSN: [id number]     Arrival date & time 03/20/13  1842 History   First MD Initiated Contact with Patient 03/20/13 1845     No chief complaint on file.  (Consider location/radiation/quality/duration/timing/severity/associated sxs/prior Treatment) HPI Comments: Patient brought to ER by EMS for evaluation after a fall. Patient complaining of left femur and hip area pain since the fall. The patient indicates that she was walking and tripped over some objects on the floor which was the cause of the fall. She does not think there was any head injury, denies headache. No neck or back pain. Patient is, however, demented with very short-term memory, is not a reasonable historian.  Patient has been complaining of severe pain according to EMS. She has been given morphine in route with minimal improvement.  Level V Caveat due to dementia.    No past medical history on file. No past surgical history on file. No family history on file. History  Substance Use Topics  . Smoking status: Not on file  . Smokeless tobacco: Not on file  . Alcohol Use: Not on file   OB History   No data available     Review of Systems  Unable to perform ROS   Allergies  Review of patient's allergies indicates not on file.  Home Medications  No current outpatient prescriptions on file. There were no vitals taken for this visit. Physical Exam  Constitutional: She is oriented to person, place, and time. She appears well-developed and well-nourished. No distress.  HENT:  Head: Normocephalic and atraumatic.  Right Ear: Hearing normal.  Left Ear: Hearing normal.  Nose: Nose normal.  Mouth/Throat: Oropharynx is clear and moist and mucous membranes are normal.  Eyes: Conjunctivae and EOM are normal. Pupils are equal, round, and reactive to light.  Neck: Normal range of motion. Neck supple.  Cardiovascular: Regular rhythm, S1 normal and S2 normal.  Exam reveals no gallop and no friction rub.   No murmur  heard. Pulmonary/Chest: Effort normal and breath sounds normal. No respiratory distress. She exhibits no tenderness.  Abdominal: Soft. Normal appearance and bowel sounds are normal. There is no hepatosplenomegaly. There is no tenderness. There is no rebound, no guarding, no tenderness at McBurney's point and negative Murphy's sign. No hernia.  Musculoskeletal:       Left hip: She exhibits decreased range of motion, tenderness and bony tenderness.       Left upper leg: She exhibits tenderness.  Neurological: She is alert and oriented to person, place, and time. She has normal strength. No cranial nerve deficit or sensory deficit. Coordination normal. GCS eye subscore is 4. GCS verbal subscore is 5. GCS motor subscore is 6.  Skin: Skin is warm, dry and intact. No rash noted. No cyanosis.  Psychiatric: She has a normal mood and affect. Her speech is normal and behavior is normal. Thought content normal.    ED Course  Procedures (including critical care time) Labs Review Labs Reviewed  SURGICAL PCR SCREEN - Abnormal; Notable for the following:    MRSA, PCR POSITIVE (*)    Staphylococcus aureus POSITIVE (*)    All other components within normal limits  CBC WITH DIFFERENTIAL - Abnormal; Notable for the following:    WBC 14.7 (*)    RBC 3.77 (*)    Hemoglobin 11.7 (*)    HCT 35.0 (*)    Neutrophils Relative % 85 (*)    Neutro Abs 12.4 (*)    Lymphocytes Relative 9 (*)    All  other components within normal limits  BASIC METABOLIC PANEL - Abnormal; Notable for the following:    Glucose, Bld 137 (*)    GFR calc non Af Amer 79 (*)    All other components within normal limits  CBC - Abnormal; Notable for the following:    RBC 2.87 (*)    Hemoglobin 8.8 (*)    HCT 27.2 (*)    Platelets 147 (*)    All other components within normal limits  BASIC METABOLIC PANEL - Abnormal; Notable for the following:    Glucose, Bld 113 (*)    BUN 25 (*)    Creatinine, Ser 1.17 (*)    Calcium 7.6 (*)     GFR calc non Af Amer 41 (*)    GFR calc Af Amer 47 (*)    All other components within normal limits  CBC - Abnormal; Notable for the following:    WBC 14.3 (*)    RBC 3.05 (*)    Hemoglobin 9.5 (*)    HCT 29.0 (*)    All other components within normal limits  CREATININE, SERUM - Abnormal; Notable for the following:    GFR calc non Af Amer 61 (*)    GFR calc Af Amer 71 (*)    All other components within normal limits  PROTIME-INR  CBC  BASIC METABOLIC PANEL   Imaging Review Dg Femur Left  03/21/2013   CLINICAL DATA:  Fracture fixation.  EXAM: LEFT FEMUR - 2 VIEW; DG C-ARM 61-120 MIN  COMPARISON:  Plain films 03/20/2013.  FINDINGS: Six fluoroscopic intraoperative spot views of the left hip are provided. Images demonstrate placement of a dynamic hip screw and long IM nail with a proximal cerclage wire and 2 distal interlocking screws for fixation of a subtrochanteric fracture. No acute abnormality is identified.  IMPRESSION: ORIF left femur fracture. No acute abnormality.   Electronically Signed   By: Drusilla Kanner M.D.   On: 03/21/2013 21:16   Dg Pelvis Portable  03/21/2013   CLINICAL DATA:  Postop left femoral IM nail fixation.  EXAM: PORTABLE PELVIS  COMPARISON:  Intraoperative radiographs same date.  FINDINGS: Patient is status post left femoral dynamic hip screw and intramedullary nail fixation of the subtrochanteric femur fracture. There is a proximal cerclage wire. The fracture demonstrates near anatomic reduction. There is no evidence of complication or new fracture.  IMPRESSION: Near anatomic reduction of the subtrochanteric left femur fracture status post fixation.   Electronically Signed   By: Roxy Horseman   On: 03/21/2013 22:26   Dg C-arm 61-120 Min  03/21/2013   CLINICAL DATA:  Fracture fixation.  EXAM: LEFT FEMUR - 2 VIEW; DG C-ARM 61-120 MIN  COMPARISON:  Plain films 03/20/2013.  FINDINGS: Six fluoroscopic intraoperative spot views of the left hip are provided. Images  demonstrate placement of a dynamic hip screw and long IM nail with a proximal cerclage wire and 2 distal interlocking screws for fixation of a subtrochanteric fracture. No acute abnormality is identified.  IMPRESSION: ORIF left femur fracture. No acute abnormality.   Electronically Signed   By: Drusilla Kanner M.D.   On: 03/21/2013 21:16    MDM  Diagnosis: Left hip fracture  Patient is admitted to the ER after a fall. Patient was complaining of severe left hip pain. Remainder of the examination was unremarkable, injury was consistent with isolated left hip injury. X-ray to confirm hip fracture. Patient for surgical treatment.    Gilda Crease, MD 03/23/13 [phone]

## 2013-03-21 ENCOUNTER — Inpatient Hospital Stay (HOSPITAL_COMMUNITY): Payer: Medicare Other

## 2013-03-21 ENCOUNTER — Encounter (HOSPITAL_COMMUNITY): Payer: Self-pay | Admitting: Internal Medicine

## 2013-03-21 ENCOUNTER — Encounter (HOSPITAL_COMMUNITY): Payer: Self-pay | Admitting: Certified Registered Nurse Anesthetist

## 2013-03-21 ENCOUNTER — Inpatient Hospital Stay (HOSPITAL_COMMUNITY): Payer: Medicare Other | Admitting: Certified Registered Nurse Anesthetist

## 2013-03-21 ENCOUNTER — Encounter (HOSPITAL_COMMUNITY)
Admission: EM | Disposition: A | Discharge status: Skilled Nursing Facility | Payer: Self-pay | Source: Home / Self Care | Attending: Internal Medicine

## 2013-03-21 DIAGNOSIS — S7222XA Displaced subtrochanteric fracture of left femur, initial encounter for closed fracture: Secondary | ICD-10-CM

## 2013-03-21 DIAGNOSIS — F0390 Unspecified dementia without behavioral disturbance: Secondary | ICD-10-CM

## 2013-03-21 DIAGNOSIS — S7223XA Displaced subtrochanteric fracture of unspecified femur, initial encounter for closed fracture: Secondary | ICD-10-CM

## 2013-03-21 HISTORY — PX: FEMUR IM NAIL: SHX1597

## 2013-03-21 LAB — SURGICAL PCR SCREEN
MRSA, PCR: POSITIVE — AB
Staphylococcus aureus: POSITIVE — AB

## 2013-03-21 LAB — CBC
HCT: 29 % — ABNORMAL LOW (ref 36.0–46.0)
Hemoglobin: 9.5 g/dL — ABNORMAL LOW (ref 12.0–15.0)
MCHC: 32.8 g/dL (ref 30.0–36.0)
MCV: 95.1 fL (ref 78.0–100.0)
RBC: 3.05 MIL/uL — ABNORMAL LOW (ref 3.87–5.11)
WBC: 14.3 10*3/uL — ABNORMAL HIGH (ref 4.0–10.5)

## 2013-03-21 LAB — CREATININE, SERUM
Creatinine, Ser: 0.84 mg/dL (ref 0.50–1.10)
GFR calc Af Amer: 71 mL/min — ABNORMAL LOW (ref >90)
GFR calc non Af Amer: 61 mL/min — ABNORMAL LOW (ref >90)

## 2013-03-21 SURGERY — INSERTION, INTRAMEDULLARY ROD, FEMUR
Anesthesia: General | Site: Hip | Laterality: Left | Wound class: Clean

## 2013-03-21 MED ORDER — LORAZEPAM 0.5 MG PO TABS
0.5000 mg | ORAL_TABLET | Freq: Two times a day (BID) | ORAL | Status: DC
  Administered 2013-03-21 – 2013-03-25 (×8): 0.5 mg via ORAL
  Filled 2013-03-21 (×8): qty 1

## 2013-03-21 MED ORDER — KETOROLAC TROMETHAMINE 30 MG/ML IJ SOLN
15.0000 mg | Freq: Once | INTRAMUSCULAR | Status: AC | PRN
  Administered 2013-03-21: 30 mg via INTRAVENOUS

## 2013-03-21 MED ORDER — FENTANYL CITRATE 0.05 MG/ML IJ SOLN
INTRAMUSCULAR | Status: DC | PRN
  Administered 2013-03-21: 100 ug via INTRAVENOUS
  Administered 2013-03-21 (×2): 50 ug via INTRAVENOUS

## 2013-03-21 MED ORDER — DONEPEZIL HCL 10 MG PO TABS
10.0000 mg | ORAL_TABLET | Freq: Every day | ORAL | Status: DC
  Administered 2013-03-22 – 2013-03-24 (×3): 10 mg via ORAL
  Filled 2013-03-21 (×5): qty 1

## 2013-03-21 MED ORDER — ENOXAPARIN SODIUM 40 MG/0.4ML ~~LOC~~ SOLN
40.0000 mg | SUBCUTANEOUS | Status: DC
  Administered 2013-03-22 – 2013-03-25 (×4): 40 mg via SUBCUTANEOUS
  Filled 2013-03-21 (×5): qty 0.4

## 2013-03-21 MED ORDER — MOMETASONE FURO-FORMOTEROL FUM 100-5 MCG/ACT IN AERO
2.0000 | INHALATION_SPRAY | Freq: Two times a day (BID) | RESPIRATORY_TRACT | Status: DC
  Administered 2013-03-21 – 2013-03-25 (×9): 2 via RESPIRATORY_TRACT
  Filled 2013-03-21 (×2): qty 8.8

## 2013-03-21 MED ORDER — ACETAMINOPHEN 325 MG PO TABS: 650.0000 mg | ORAL_TABLET | Freq: Four times a day (QID) | ORAL | Status: DC | PRN

## 2013-03-21 MED ORDER — ONDANSETRON HCL 4 MG/2ML IJ SOLN: 4.0000 mg | Freq: Once | INTRAMUSCULAR | Status: DC | PRN

## 2013-03-21 MED ORDER — FENTANYL CITRATE 0.05 MG/ML IJ SOLN
INTRAMUSCULAR | Status: AC
  Filled 2013-03-21: qty 2

## 2013-03-21 MED ORDER — ONDANSETRON HCL 4 MG/2ML IJ SOLN: 4.0000 mg | Freq: Four times a day (QID) | INTRAMUSCULAR | Status: DC | PRN

## 2013-03-21 MED ORDER — SODIUM CHLORIDE 0.9 % IV SOLN: INTRAVENOUS | Status: DC

## 2013-03-21 MED ORDER — MORPHINE SULFATE 2 MG/ML IJ SOLN: 1.0000 mg | INTRAMUSCULAR | Status: DC | PRN

## 2013-03-21 MED ORDER — ROCURONIUM BROMIDE 100 MG/10ML IV SOLN
INTRAVENOUS | Status: DC | PRN
  Administered 2013-03-21: 50 mg via INTRAVENOUS

## 2013-03-21 MED ORDER — MORPHINE SULFATE 2 MG/ML IJ SOLN
0.5000 mg | INTRAMUSCULAR | Status: DC | PRN
  Filled 2013-03-21 (×2): qty 1

## 2013-03-21 MED ORDER — MENTHOL 3 MG MT LOZG: 1.0000 | LOZENGE | OROMUCOSAL | Status: DC | PRN

## 2013-03-21 MED ORDER — LOSARTAN POTASSIUM 50 MG PO TABS
50.0000 mg | ORAL_TABLET | Freq: Every day | ORAL | Status: DC
  Administered 2013-03-21 – 2013-03-23 (×3): 50 mg via ORAL
  Filled 2013-03-21 (×3): qty 1

## 2013-03-21 MED ORDER — OXYCODONE HCL 5 MG/5ML PO SOLN
ORAL | Status: AC
  Filled 2013-03-21: qty 5

## 2013-03-21 MED ORDER — METOCLOPRAMIDE HCL 5 MG/ML IJ SOLN: 5.0000 mg | Freq: Three times a day (TID) | INTRAMUSCULAR | Status: DC | PRN

## 2013-03-21 MED ORDER — IPRATROPIUM BROMIDE 0.02 % IN SOLN: 0.5000 mg | Freq: Four times a day (QID) | RESPIRATORY_TRACT | Status: DC | PRN

## 2013-03-21 MED ORDER — HYDROCODONE-ACETAMINOPHEN 5-325 MG PO TABS
1.0000 | ORAL_TABLET | Freq: Four times a day (QID) | ORAL | Status: DC | PRN
  Administered 2013-03-21 – 2013-03-23 (×7): 2 via ORAL
  Administered 2013-03-23: 1 via ORAL
  Filled 2013-03-21 (×3): qty 2
  Filled 2013-03-21: qty 1

## 2013-03-21 MED ORDER — LIDOCAINE HCL (CARDIAC) 20 MG/ML IV SOLN
INTRAVENOUS | Status: DC | PRN
  Administered 2013-03-21: 100 mg via INTRAVENOUS

## 2013-03-21 MED ORDER — HYDROCODONE-ACETAMINOPHEN 5-325 MG PO TABS: 1.0000 | ORAL_TABLET | Freq: Four times a day (QID) | ORAL | Status: DC | PRN

## 2013-03-21 MED ORDER — HYDROCODONE-ACETAMINOPHEN 5-325 MG PO TABS
1.0000 | ORAL_TABLET | Freq: Four times a day (QID) | ORAL | Status: DC | PRN
  Filled 2013-03-21 (×3): qty 2

## 2013-03-21 MED ORDER — IPRATROPIUM-ALBUTEROL 0.5-2.5 (3) MG/3ML IN SOLN: 3.0000 mL | Freq: Four times a day (QID) | RESPIRATORY_TRACT | Status: DC | PRN

## 2013-03-21 MED ORDER — ATORVASTATIN CALCIUM 10 MG PO TABS
10.0000 mg | ORAL_TABLET | Freq: Every day | ORAL | Status: DC
  Administered 2013-03-21 – 2013-03-25 (×5): 10 mg via ORAL
  Filled 2013-03-21 (×5): qty 1

## 2013-03-21 MED ORDER — VANCOMYCIN HCL IN DEXTROSE 1-5 GM/200ML-% IV SOLN
1000.0000 mg | Freq: Two times a day (BID) | INTRAVENOUS | Status: AC
  Administered 2013-03-22: 1000 mg via INTRAVENOUS
  Filled 2013-03-21: qty 200

## 2013-03-21 MED ORDER — CEFAZOLIN SODIUM-DEXTROSE 2-3 GM-% IV SOLR
INTRAVENOUS | Status: AC
  Filled 2013-03-21: qty 50

## 2013-03-21 MED ORDER — FERROUS SULFATE 325 (65 FE) MG PO TABS
325.0000 mg | ORAL_TABLET | Freq: Three times a day (TID) | ORAL | Status: DC
  Administered 2013-03-22 – 2013-03-25 (×8): 325 mg via ORAL
  Filled 2013-03-21 (×14): qty 1

## 2013-03-21 MED ORDER — VANCOMYCIN HCL 1000 MG IV SOLR
1000.0000 mg | Freq: Two times a day (BID) | INTRAVENOUS | Status: DC
  Administered 2013-03-21: 1000 mg via INTRAVENOUS
  Filled 2013-03-21: qty 1000

## 2013-03-21 MED ORDER — GLYCOPYRROLATE 0.2 MG/ML IJ SOLN
INTRAMUSCULAR | Status: DC | PRN
  Administered 2013-03-21: .8 mg via INTRAVENOUS

## 2013-03-21 MED ORDER — OXYCODONE HCL 5 MG PO TABS: 5.0000 mg | ORAL_TABLET | Freq: Once | ORAL | Status: AC | PRN

## 2013-03-21 MED ORDER — ACETAMINOPHEN 650 MG RE SUPP: 650.0000 mg | Freq: Four times a day (QID) | RECTAL | Status: DC | PRN

## 2013-03-21 MED ORDER — ALBUTEROL SULFATE (5 MG/ML) 0.5% IN NEBU
2.5000 mg | INHALATION_SOLUTION | Freq: Four times a day (QID) | RESPIRATORY_TRACT | Status: DC | PRN
  Administered 2013-03-22: 2.5 mg via RESPIRATORY_TRACT
  Filled 2013-03-21: qty 0.5

## 2013-03-21 MED ORDER — LACTATED RINGERS IV SOLN
INTRAVENOUS | Status: DC
  Administered 2013-03-21 – 2013-03-24 (×3): via INTRAVENOUS

## 2013-03-21 MED ORDER — ONDANSETRON HCL 4 MG PO TABS: 4.0000 mg | ORAL_TABLET | Freq: Four times a day (QID) | ORAL | Status: DC | PRN

## 2013-03-21 MED ORDER — MORPHINE SULFATE 2 MG/ML IJ SOLN
0.5000 mg | INTRAMUSCULAR | Status: DC | PRN
  Administered 2013-03-21 (×2): 1 mg via INTRAVENOUS
  Administered 2013-03-22 – 2013-03-24 (×3): 0.5 mg via INTRAVENOUS
  Administered 2013-03-24 (×2): 1 mg via INTRAVENOUS
  Filled 2013-03-21 (×5): qty 1

## 2013-03-21 MED ORDER — PHENOL 1.4 % MT LIQD: 1.0000 | OROMUCOSAL | Status: DC | PRN

## 2013-03-21 MED ORDER — FENTANYL CITRATE 0.05 MG/ML IJ SOLN: 25.0000 ug | INTRAMUSCULAR | Status: DC | PRN

## 2013-03-21 MED ORDER — PNEUMOCOCCAL VAC POLYVALENT 25 MCG/0.5ML IJ INJ
0.5000 mL | INJECTION | INTRAMUSCULAR | Status: AC
  Administered 2013-03-22: 0.5 mL via INTRAMUSCULAR
  Filled 2013-03-21: qty 0.5

## 2013-03-21 MED ORDER — PROMETHAZINE HCL 25 MG/ML IJ SOLN: 6.2500 mg | INTRAMUSCULAR | Status: DC | PRN

## 2013-03-21 MED ORDER — NEOSTIGMINE METHYLSULFATE 1 MG/ML IJ SOLN
INTRAMUSCULAR | Status: DC | PRN
  Administered 2013-03-21: 5 mg via INTRAVENOUS

## 2013-03-21 MED ORDER — LACTATED RINGERS IV SOLN
INTRAVENOUS | Status: DC | PRN
  Administered 2013-03-21 (×2): via INTRAVENOUS

## 2013-03-21 MED ORDER — MORPHINE SULFATE 2 MG/ML IJ SOLN: 0.5000 mg | INTRAMUSCULAR | Status: DC | PRN

## 2013-03-21 MED ORDER — VANCOMYCIN HCL IN DEXTROSE 1-5 GM/200ML-% IV SOLN
INTRAVENOUS | Status: AC
  Filled 2013-03-21: qty 200

## 2013-03-21 MED ORDER — FENTANYL CITRATE 0.05 MG/ML IJ SOLN
25.0000 ug | INTRAMUSCULAR | Status: DC | PRN
  Administered 2013-03-21: 50 ug via INTRAVENOUS

## 2013-03-21 MED ORDER — PHENYLEPHRINE HCL 10 MG/ML IJ SOLN
INTRAMUSCULAR | Status: DC | PRN
  Administered 2013-03-21 (×5): 80 ug via INTRAVENOUS
  Administered 2013-03-21: 40 ug via INTRAVENOUS

## 2013-03-21 MED ORDER — ENOXAPARIN SODIUM 40 MG/0.4ML ~~LOC~~ SOLN: 40.0000 mg | SUBCUTANEOUS | Status: DC

## 2013-03-21 MED ORDER — KETOROLAC TROMETHAMINE 30 MG/ML IJ SOLN
INTRAMUSCULAR | Status: AC
  Filled 2013-03-21: qty 1

## 2013-03-21 MED ORDER — ONDANSETRON HCL 4 MG/2ML IJ SOLN
INTRAMUSCULAR | Status: DC | PRN
  Administered 2013-03-21: 4 mg via INTRAVENOUS

## 2013-03-21 MED ORDER — PROPOFOL 10 MG/ML IV BOLUS
INTRAVENOUS | Status: DC | PRN
  Administered 2013-03-21: 110 mg via INTRAVENOUS
  Administered 2013-03-21: 10 mg via INTRAVENOUS

## 2013-03-21 MED ORDER — INFLUENZA VAC SPLIT QUAD 0.5 ML IM SUSP
0.5000 mL | INTRAMUSCULAR | Status: AC
  Administered 2013-03-22: 0.5 mL via INTRAMUSCULAR
  Filled 2013-03-21: qty 0.5

## 2013-03-21 MED ORDER — CEFAZOLIN SODIUM-DEXTROSE 2-3 GM-% IV SOLR
2.0000 g | Freq: Once | INTRAVENOUS | Status: AC
  Administered 2013-03-21: 2 g via INTRAVENOUS

## 2013-03-21 MED ORDER — METOCLOPRAMIDE HCL 10 MG PO TABS: 5.0000 mg | ORAL_TABLET | Freq: Three times a day (TID) | ORAL | Status: DC | PRN

## 2013-03-21 MED ORDER — OXYCODONE HCL 5 MG/5ML PO SOLN
5.0000 mg | Freq: Once | ORAL | Status: AC | PRN
  Administered 2013-03-21: 5 mg via ORAL

## 2013-03-21 SURGICAL SUPPLY — 52 items
BANDAGE ELASTIC 4 VELCRO ST LF (GAUZE/BANDAGES/DRESSINGS) ×2 IMPLANT
BANDAGE ELASTIC 6 VELCRO ST LF (GAUZE/BANDAGES/DRESSINGS) ×2 IMPLANT
BIT DRILL 4.3MMS DISTAL GRDTED (BIT) IMPLANT
CABLE (Orthopedic Implant) ×2 IMPLANT
CLOTH BEACON ORANGE TIMEOUT ST (SAFETY) ×2 IMPLANT
COVER MAYO STAND STRL (DRAPES) ×1 IMPLANT
COVER SURGICAL LIGHT HANDLE (MISCELLANEOUS) ×2 IMPLANT
DRAPE C-ARM 42X72 X-RAY (DRAPES) ×2 IMPLANT
DRAPE INCISE IOBAN 66X45 STRL (DRAPES) ×3 IMPLANT
DRAPE ORTHO SPLIT 77X108 STRL (DRAPES) ×4
DRAPE PROXIMA HALF (DRAPES) ×4 IMPLANT
DRAPE STERI IOBAN 125X83 (DRAPES) IMPLANT
DRAPE SURG ORHT 6 SPLT 77X108 (DRAPES) ×2 IMPLANT
DRAPE U-SHAPE 47X51 STRL (DRAPES) ×2 IMPLANT
DRILL 4.3MMS DISTAL GRADUATED (BIT) ×2
DRSG ADAPTIC 3X8 NADH LF (GAUZE/BANDAGES/DRESSINGS) ×2 IMPLANT
DRSG MEPILEX BORDER 4X12 (GAUZE/BANDAGES/DRESSINGS) ×2 IMPLANT
DRSG MEPILEX BORDER 4X4 (GAUZE/BANDAGES/DRESSINGS) ×2 IMPLANT
DRSG MEPILEX BORDER 4X8 (GAUZE/BANDAGES/DRESSINGS) ×2 IMPLANT
DRSG PAD ABDOMINAL 8X10 ST (GAUZE/BANDAGES/DRESSINGS) ×4 IMPLANT
DURAPREP 26ML APPLICATOR (WOUND CARE) ×2 IMPLANT
ELECT REM PT RETURN 9FT ADLT (ELECTROSURGICAL) ×2
ELECTRODE REM PT RTRN 9FT ADLT (ELECTROSURGICAL) ×1 IMPLANT
GLOVE BIOGEL PI ORTHO PRO 7.5 (GLOVE) ×3
GLOVE BIOGEL PI ORTHO PRO SZ8 (GLOVE) ×2
GLOVE ORTHO TXT STRL SZ7.5 (GLOVE) ×2 IMPLANT
GLOVE PI ORTHO PRO STRL 7.5 (GLOVE) ×1 IMPLANT
GLOVE PI ORTHO PRO STRL SZ8 (GLOVE) ×2 IMPLANT
GLOVE SURG ORTHO 8.5 STRL (GLOVE) ×6 IMPLANT
GOWN STRL NON-REIN LRG LVL3 (GOWN DISPOSABLE) ×6 IMPLANT
GOWN STRL REIN XL XLG (GOWN DISPOSABLE) ×5 IMPLANT
GUIDEWIRE BALL NOSE 80CM (WIRE) ×1 IMPLANT
KIT BASIN OR (CUSTOM PROCEDURE TRAY) ×2 IMPLANT
KIT ROOM TURNOVER OR (KITS) ×2 IMPLANT
MANIFOLD NEPTUNE II (INSTRUMENTS) ×2 IMPLANT
NAIL HIP FRA AFFIX 130X9X340 L (Nail) ×2 IMPLANT
NS IRRIG 1000ML POUR BTL (IV SOLUTION) ×2 IMPLANT
PACK GENERAL/GYN (CUSTOM PROCEDURE TRAY) ×2 IMPLANT
PAD ARMBOARD 7.5X6 YLW CONV (MISCELLANEOUS) ×4 IMPLANT
PAD CAST 4YDX4 CTTN HI CHSV (CAST SUPPLIES) ×1 IMPLANT
PADDING CAST COTTON 4X4 STRL (CAST SUPPLIES) ×2
SCREW BONE CORTICAL 5.0X36 (Screw) ×1 IMPLANT
SCREW BONE CORTICAL 5.0X38 (Screw) ×2 IMPLANT
SCREW LAG HIP NAIL 10.5X95 (Screw) ×1 IMPLANT
STAPLER VISISTAT 35W (STAPLE) IMPLANT
SUT ETHILON 4 0 PS 2 18 (SUTURE) IMPLANT
SUT VIC AB 0 CTB1 27 (SUTURE) IMPLANT
SUT VIC AB 1 CT1 27 (SUTURE) ×6
SUT VIC AB 1 CT1 27XBRD ANBCTR (SUTURE) IMPLANT
SUT VIC AB 2-0 CT1 27 (SUTURE)
SUT VIC AB 2-0 CT1 TAPERPNT 27 (SUTURE) IMPLANT
WATER STERILE IRR 1000ML POUR (IV SOLUTION) ×4 IMPLANT

## 2013-03-21 NOTE — Anesthesia Postprocedure Evaluation (Signed)
  Anesthesia Post-op Note  Patient: Lisa Watts  Procedure(s) Performed: Procedure(s): INTRAMEDULLARY (IM) NAIL FEMORAL (Left)  Patient Location: PACU  Anesthesia Type:General  Level of Consciousness: awake  Airway and Oxygen Therapy: Patient Spontanous Breathing  Post-op Pain: mild  Post-op Assessment: Post-op Vital signs reviewed  Post-op Vital Signs: stable  Complications: No apparent anesthesia complications

## 2013-03-21 NOTE — ED Notes (Signed)
Report to Windell Moulding on 5N.  Pt will go to floor on stretcher with EMT.

## 2013-03-21 NOTE — Progress Notes (Signed)
HPI from this AM reviewed  Pt presents with mechanical fall now with femur fracture. Ortho consulted with recs for surgery. No acute events overnight. Vitals reviewed and are stable. bloodwork unremarkable. Pending surgery. Will follow.

## 2013-03-21 NOTE — Transfer of Care (Signed)
Immediate Anesthesia Transfer of Care Note  Patient: Lisa Watts  Procedure(s) Performed: Procedure(s): INTRAMEDULLARY (IM) NAIL FEMORAL (Left)  Patient Location: PACU  Anesthesia Type:General  Level of Consciousness: awake, alert  and confused  Airway & Oxygen Therapy: Patient connected to face mask oxygen  Post-op Assessment: Report given to PACU RN  Post vital signs: stable  Complications: No apparent anesthesia complications

## 2013-03-21 NOTE — Brief Op Note (Signed)
03/20/2013 - 03/21/2013  6:15 PM  PATIENT:  Marchia Bond  79 y.o. female  PRE-OPERATIVE DIAGNOSIS:  Left subtrochanteric femur fracture, displaced, comminuted  POST-OPERATIVE DIAGNOSIS:  same  PROCEDURE:  Procedure(s): INTRAMEDULLARY (IM) NAIL FEMORAL (Left), DePuy Affixus  SURGEON:  Surgeon(s) and Role:    * Verlee Rossetti, MD - Primary  PHYSICIAN ASSISTANT:   ASSISTANTS: Thea Gist, PA-C   ANESTHESIA:   general  EBL:  Total I/O In: 1000 [I.V.:1000] Out: 670 [Urine:320; Blood:350]  BLOOD ADMINISTERED:none  DRAINS: none   LOCAL MEDICATIONS USED:  NONE  SPECIMEN:  No Specimen  DISPOSITION OF SPECIMEN:  N/A  COUNTS:  YES  TOURNIQUET:  * No tourniquets in log *  DICTATION: .Other Dictation: Dictation Number [id number]  PLAN OF CARE: Admit to inpatient   PATIENT DISPOSITION:  PACU - hemodynamically stable.   Delay start of Pharmacological VTE agent (>24hrs) due to surgical blood loss or risk of bleeding: no

## 2013-03-21 NOTE — H&P (Addendum)
[facility] History and Physical  Lisa Watts ZOX:[id number] DOB: [birth year] DOA: 03/20/2013  Referring physician: ED PCP: No PCP Per Patient   Chief Complaint: L hip pain  HPI: Lisa Watts is a 79 y.o. female who presents to the ED after a fall.  Patient complaining of L hip and back pain since the fall.  She was walking and tripped over some objects on the floor (mechanical fall), no head injury, no LOC.  Patient does have fairly severe dementia with very limited short term memory (doesnt remember being told about the hip fracture a couple of hours ago in the ED and when I explained that she had a hip fracture she states "oh, is that what happened?").  Review of Systems: 12 systems reviewed and otherwise negative.  Past Medical History  Diagnosis Date  . Hypertension   . Dementia   . Osteoporosis with fracture   . GI bleed 2011   Past Surgical History  Procedure Laterality Date  . Cholecystectomy      "distant past"   Social History:  reports that she has never smoked. She does not have any smokeless tobacco history on file. She reports that she does not drink alcohol or use illicit drugs.   No Known Allergies  No family history on file.  Prior to Admission medications   Medication Sig Start Date End Date Taking? Authorizing Provider  atorvastatin (LIPITOR) 10 MG tablet Take 10 mg by mouth daily.   Yes Historical Provider, MD  donepezil (ARICEPT) 10 MG tablet Take 10 mg by mouth at bedtime.    Yes Historical Provider, MD  Fluticasone-Salmeterol (ADVAIR) 250-50 MCG/DOSE AEPB Inhale 1 puff into the lungs every 12 (twelve) hours.   Yes Historical Provider, MD  ipratropium-albuterol (DUONEB) 0.5-2.5 (3) MG/3ML SOLN Take 3 mLs by nebulization every 6 (six) hours as needed.   Yes Historical Provider, MD  LORazepam (ATIVAN) 0.5 MG tablet Take 0.5 mg by mouth 2 (two) times daily.   Yes Historical Provider, MD  losartan (COZAAR) 50 MG tablet Take 50 mg by mouth daily.    Yes Historical Provider, MD  Multiple Vitamin (MULTIVITAMIN WITH MINERALS) TABS tablet Take 1 tablet by mouth daily.   Yes Historical Provider, MD   Physical Exam: Filed Vitals:   03/21/13 0400  BP: 178/76  Pulse: 102  Temp: 98.1 F (36.7 C)  Resp: 20    General:  NAD, resting comfortably in bed Eyes: PEERLA EOMI ENT: mucous membranes moist Neck: supple w/o JVD Cardiovascular: RRR w/o MRG Respiratory: CTA B Abdomen: soft, nt, nd, bs+ Skin: no rash nor lesion Musculoskeletal: MAE, full ROM all 4 extremities Psychiatric: normal tone and affect Neurologic: AAOx3, grossly non-focal  Labs on Admission:  Basic Metabolic Panel:  Recent Labs Lab 03/20/13 2046  NA 135  K 3.9  CL 97  CO2 29  GLUCOSE 137*  BUN 16  CREATININE 0.63  CALCIUM 8.6   Liver Function Tests: No results found for this basename: AST, ALT, ALKPHOS, BILITOT, PROT, ALBUMIN,  in the last 168 hours No results found for this basename: LIPASE, AMYLASE,  in the last 168 hours No results found for this basename: AMMONIA,  in the last 168 hours CBC:  Recent Labs Lab 03/20/13 2046  WBC 14.7*  NEUTROABS 12.4*  HGB 11.7*  HCT 35.0*  MCV 92.8  PLT 207   Cardiac Enzymes: No results found for this basename: CKTOTAL, CKMB, CKMBINDEX, TROPONINI,  in the last 168 hours  BNP (last 3  results) No results found for this basename: PROBNP,  in the last 8760 hours CBG: No results found for this basename: GLUCAP,  in the last 168 hours  Radiological Exams on Admission: Dg Chest 1 View  03/20/2013   CLINICAL DATA:  Pain post trauma  EXAM: CHEST - 1 VIEW  COMPARISON:  March 04, 2010  FINDINGS: There is no edema or consolidation. Heart is upper normal in size with normal pulmonary vascularity. Aorta is prominent with atherosclerotic change, stable. No adenopathy. No pneumothorax. No bone lesions. There is upper thoracic levoscoliosis and thoracolumbar dextroscoliosis.  IMPRESSION: No edema or consolidation. No  pneumothorax.   Electronically Signed   By: Bretta Bang   On: 03/20/2013 20:51   Dg Pelvis 1-2 Views  03/20/2013   CLINICAL DATA:  Pain post trauma  EXAM: PELVIS - 1-2 VIEW  COMPARISON:  February 28, 2010  FINDINGS: There is a comminuted fracture of the proximal femur which arises in the subtrochanteric region. There is avulsion of the lesser trochanter with medial displacement of the lesser trochanter. No other fractures. No dislocation. There is mild symmetric narrowing of both hip joints. Bones are osteoporotic.  IMPRESSION: Left-sided subtrochanteric comminuted fracture. Avulsion of lesser trochanter.   Electronically Signed   By: Bretta Bang   On: 03/20/2013 20:55   Dg Femur Left  03/20/2013   CLINICAL DATA:  Status post fall with left leg pain.  EXAM: LEFT FEMUR - 2 VIEW  COMPARISON:  Pelvic radiographs 02/28/2010.  FINDINGS: There is an oblique, mildly comminuted and mildly displaced subtrochanteric fracture of the left femur. This demonstrates medial displacement. On the lateral view, there is moderate apex anterior angulation. There is no dislocation. The distal femur is suboptimally evaluated in the frontal examination due to the proximal injury.  IMPRESSION: Angulated and mildly displaced subtrochanteric fracture of the left femur.   Electronically Signed   By: Roxy Horseman   On: 03/20/2013 20:57   Ct Head Wo Contrast  03/20/2013   CLINICAL DATA:  Extreme leg and left hip pain status post fall.  EXAM: CT HEAD WITHOUT CONTRAST  CT CERVICAL SPINE WITHOUT CONTRAST  TECHNIQUE: Multidetector CT imaging of the head and cervical spine was performed following the standard protocol without intravenous contrast. Multiplanar CT image reconstructions of the cervical spine were also generated.  COMPARISON:  Head CT 02/28/2010. Cervical spine CT 01/23/2010.  FINDINGS: CT HEAD FINDINGS  There is chronic atrophy and periventricular white matter disease. A small lacunar infarct in the right lentiform  nucleus has developed in the interval. Chronic small vessel ischemic changes in the left thalamus are stable. There is no evidence of acute intracranial hemorrhage, mass lesion, brain edema or extra-axial fluid collection.  The visualized paranasal sinuses, mastoid air cells and middle ears are clear. The calvarium is intact.  CT CERVICAL SPINE FINDINGS  There is no evidence of acute cervical spine fracture, traumatic subluxation or focal paraspinal abnormality. Multilevel spondylosis is again noted with progressive generalized kyphosis. The C4-5 facet joints appear ankylosed. The lung apices are clear.  IMPRESSION: CT HEAD IMPRESSION  No acute intracranial or calvarial findings. Progressive chronic small vessel ischemic changes as described.  CT CERVICAL SPINE IMPRESSION  No evidence of acute cervical spine fracture, traumatic subluxation or static signs of instability. There is multilevel spondylosis with progressive reversal of the usual cervical lordosis.   Electronically Signed   By: Roxy Horseman   On: 03/20/2013 20:07   Ct Cervical Spine Wo Contrast  03/20/2013  CLINICAL DATA:  Extreme leg and left hip pain status post fall.  EXAM: CT HEAD WITHOUT CONTRAST  CT CERVICAL SPINE WITHOUT CONTRAST  TECHNIQUE: Multidetector CT imaging of the head and cervical spine was performed following the standard protocol without intravenous contrast. Multiplanar CT image reconstructions of the cervical spine were also generated.  COMPARISON:  Head CT 02/28/2010. Cervical spine CT 01/23/2010.  FINDINGS: CT HEAD FINDINGS  There is chronic atrophy and periventricular white matter disease. A small lacunar infarct in the right lentiform nucleus has developed in the interval. Chronic small vessel ischemic changes in the left thalamus are stable. There is no evidence of acute intracranial hemorrhage, mass lesion, brain edema or extra-axial fluid collection.  The visualized paranasal sinuses, mastoid air cells and middle ears are  clear. The calvarium is intact.  CT CERVICAL SPINE FINDINGS  There is no evidence of acute cervical spine fracture, traumatic subluxation or focal paraspinal abnormality. Multilevel spondylosis is again noted with progressive generalized kyphosis. The C4-5 facet joints appear ankylosed. The lung apices are clear.  IMPRESSION: CT HEAD IMPRESSION  No acute intracranial or calvarial findings. Progressive chronic small vessel ischemic changes as described.  CT CERVICAL SPINE IMPRESSION  No evidence of acute cervical spine fracture, traumatic subluxation or static signs of instability. There is multilevel spondylosis with progressive reversal of the usual cervical lordosis.   Electronically Signed   By: Roxy Horseman   On: 03/20/2013 20:07    EKG: Ordered and pending.  Assessment/Plan Principal Problem:   Subtrochanteric fracture of left femur   1. Subtrochanteric fracture of left femur - will require operative repair, on hip fracture protocol, NPO, increasing morphine dose from 0.5mg  PRN to 1mg  PRN at RN request since 0.5 didn't do much for the patient (increase further as needed but being very careful of overdose risk).  Regarding her heart risk: patient is not at all a good historian secondary to her baseline dementia, a dissection of previous records indicates a history of GI bleed in 2011, dementia, HTN.  Other than the HTN not a whole lot of cardiac risk factors in her past.  EKG ordered and pending at this time since ED EKG does not appear to have shown up in the system.    Code Status: No paperwork indicating otherwise, must assume patient full code (must indicate code status--if unknown or must be presumed, indicate so) Family Communication: No family in room, am given to understand patient is a ward of the state (indicate person spoken with, if applicable, with phone number if by telephone) Disposition Plan: Admit to inpatient (indicate anticipated LOS)  Time spent: 70 min  GARDNER, JARED  M. [facility] Pager [phone]  If 7PM-7AM, please contact night-coverage [email or web address] Password [email or web address] 03/21/2013, 4:44 AM

## 2013-03-21 NOTE — H&P (View-Only) (Signed)
Reason for Consult:left hip pain Referring Physician: EDP  HPI: Lisa Watts is an 79 y.o. female resident of SNF with PMH significant for dementia, fell today with complaints of left hip pain and inability to bear weight.   Past Medical History  Diagnosis Date  . Hypertension     No past surgical history on file.  No family history on file.  Social History:  has no tobacco, alcohol, and drug history on file.  Allergies: No Known Allergies  Medications: med list pending  Results for orders placed during the hospital encounter of 03/20/13 (from the past 48 hour(s))  CBC WITH DIFFERENTIAL     Status: Abnormal   Collection Time    03/20/13  8:46 PM      Result Value Range   WBC 14.7 (*) 4.0 - 10.5 K/uL   RBC 3.77 (*) 3.87 - 5.11 MIL/uL   Hemoglobin 11.7 (*) 12.0 - 15.0 g/dL   HCT 35.0 (*) 36.0 - 46.0 %   MCV 92.8  78.0 - 100.0 fL   MCH 31.0  26.0 - 34.0 pg   MCHC 33.4  30.0 - 36.0 g/dL   RDW 13.2  11.5 - 15.5 %   Platelets 207  150 - 400 K/uL   Neutrophils Relative % 85 (*) 43 - 77 %   Neutro Abs 12.4 (*) 1.7 - 7.7 K/uL   Lymphocytes Relative 9 (*) 12 - 46 %   Lymphs Abs 1.3  0.7 - 4.0 K/uL   Monocytes Relative 6  3 - 12 %   Monocytes Absolute 0.9  0.1 - 1.0 K/uL   Eosinophils Relative 0  0 - 5 %   Eosinophils Absolute 0.0  0.0 - 0.7 K/uL   Basophils Relative 0  0 - 1 %   Basophils Absolute 0.0  0.0 - 0.1 K/uL  BASIC METABOLIC PANEL     Status: Abnormal   Collection Time    03/20/13  8:46 PM      Result Value Range   Sodium 135  135 - 145 mEq/L   Potassium 3.9  3.5 - 5.1 mEq/L   Chloride 97  96 - 112 mEq/L   CO2 29  19 - 32 mEq/L   Glucose, Bld 137 (*) 70 - 99 mg/dL   BUN 16  6 - 23 mg/dL   Creatinine, Ser 0.63  0.50 - 1.10 mg/dL   Calcium 8.6  8.4 - 10.5 mg/dL   GFR calc non Af Amer 79 (*) >90 mL/min   GFR calc Af Amer >90  >90 mL/min   Comment: (NOTE)     The eGFR has been calculated using the CKD EPI equation.     This calculation has not been validated  in all clinical situations.     eGFR's persistently <90 mL/min signify possible Chronic Kidney     Disease.  PROTIME-INR     Status: None   Collection Time    03/20/13  8:46 PM      Result Value Range   Prothrombin Time 14.8  11.6 - 15.2 seconds   INR 1.19  0.00 - 1.49    Dg Chest 1 View  03/20/2013   CLINICAL DATA:  Pain post trauma  EXAM: CHEST - 1 VIEW  COMPARISON:  March 04, 2010  FINDINGS: There is no edema or consolidation. Heart is upper normal in size with normal pulmonary vascularity. Aorta is prominent with atherosclerotic change, stable. No adenopathy. No pneumothorax. No bone lesions. There is upper thoracic   levoscoliosis and thoracolumbar dextroscoliosis.  IMPRESSION: No edema or consolidation. No pneumothorax.   Electronically Signed   By: William  Woodruff   On: 03/20/2013 20:51   Dg Pelvis 1-2 Views  03/20/2013   CLINICAL DATA:  Pain post trauma  EXAM: PELVIS - 1-2 VIEW  COMPARISON:  February 28, 2010  FINDINGS: There is a comminuted fracture of the proximal femur which arises in the subtrochanteric region. There is avulsion of the lesser trochanter with medial displacement of the lesser trochanter. No other fractures. No dislocation. There is mild symmetric narrowing of both hip joints. Bones are osteoporotic.  IMPRESSION: Left-sided subtrochanteric comminuted fracture. Avulsion of lesser trochanter.   Electronically Signed   By: William  Woodruff   On: 03/20/2013 20:55   Dg Femur Left  03/20/2013   CLINICAL DATA:  Status post fall with left leg pain.  EXAM: LEFT FEMUR - 2 VIEW  COMPARISON:  Pelvic radiographs 02/28/2010.  FINDINGS: There is an oblique, mildly comminuted and mildly displaced subtrochanteric fracture of the left femur. This demonstrates medial displacement. On the lateral view, there is moderate apex anterior angulation. There is no dislocation. The distal femur is suboptimally evaluated in the frontal examination due to the proximal injury.  IMPRESSION:  Angulated and mildly displaced subtrochanteric fracture of the left femur.   Electronically Signed   By: Bill  Veazey   On: 03/20/2013 20:57   Ct Head Wo Contrast  03/20/2013   CLINICAL DATA:  Extreme leg and left hip pain status post fall.  EXAM: CT HEAD WITHOUT CONTRAST  CT CERVICAL SPINE WITHOUT CONTRAST  TECHNIQUE: Multidetector CT imaging of the head and cervical spine was performed following the standard protocol without intravenous contrast. Multiplanar CT image reconstructions of the cervical spine were also generated.  COMPARISON:  Head CT 02/28/2010. Cervical spine CT 01/23/2010.  FINDINGS: CT HEAD FINDINGS  There is chronic atrophy and periventricular white matter disease. A small lacunar infarct in the right lentiform nucleus has developed in the interval. Chronic small vessel ischemic changes in the left thalamus are stable. There is no evidence of acute intracranial hemorrhage, mass lesion, brain edema or extra-axial fluid collection.  The visualized paranasal sinuses, mastoid air cells and middle ears are clear. The calvarium is intact.  CT CERVICAL SPINE FINDINGS  There is no evidence of acute cervical spine fracture, traumatic subluxation or focal paraspinal abnormality. Multilevel spondylosis is again noted with progressive generalized kyphosis. The C4-5 facet joints appear ankylosed. The lung apices are clear.  IMPRESSION: CT HEAD IMPRESSION  No acute intracranial or calvarial findings. Progressive chronic small vessel ischemic changes as described.  CT CERVICAL SPINE IMPRESSION  No evidence of acute cervical spine fracture, traumatic subluxation or static signs of instability. There is multilevel spondylosis with progressive reversal of the usual cervical lordosis.   Electronically Signed   By: Bill  Veazey   On: 03/20/2013 20:07   Ct Cervical Spine Wo Contrast  03/20/2013   CLINICAL DATA:  Extreme leg and left hip pain status post fall.  EXAM: CT HEAD WITHOUT CONTRAST  CT CERVICAL SPINE  WITHOUT CONTRAST  TECHNIQUE: Multidetector CT imaging of the head and cervical spine was performed following the standard protocol without intravenous contrast. Multiplanar CT image reconstructions of the cervical spine were also generated.  COMPARISON:  Head CT 02/28/2010. Cervical spine CT 01/23/2010.  FINDINGS: CT HEAD FINDINGS  There is chronic atrophy and periventricular white matter disease. A small lacunar infarct in the right lentiform nucleus has developed in the interval.   Chronic small vessel ischemic changes in the left thalamus are stable. There is no evidence of acute intracranial hemorrhage, mass lesion, brain edema or extra-axial fluid collection.  The visualized paranasal sinuses, mastoid air cells and middle ears are clear. The calvarium is intact.  CT CERVICAL SPINE FINDINGS  There is no evidence of acute cervical spine fracture, traumatic subluxation or focal paraspinal abnormality. Multilevel spondylosis is again noted with progressive generalized kyphosis. The C4-5 facet joints appear ankylosed. The lung apices are clear.  IMPRESSION: CT HEAD IMPRESSION  No acute intracranial or calvarial findings. Progressive chronic small vessel ischemic changes as described.  CT CERVICAL SPINE IMPRESSION  No evidence of acute cervical spine fracture, traumatic subluxation or static signs of instability. There is multilevel spondylosis with progressive reversal of the usual cervical lordosis.   Electronically Signed   By: Bill  Veazey   On: 03/20/2013 20:07     Vitals Temp:  [98.4 F (36.9 C)] 98.4 F (36.9 C) (09/21 1905) Pulse Rate:  [92-97] 97 (09/21 2211) Resp:  [18-22] 18 (09/21 2211) BP: (120-152)/(50-68) 120/52 mmHg (09/21 2211) SpO2:  [94 %-99 %] 99 % (09/21 2211) There is no weight on file to calculate BMI.  Physical Exam: pleasantly confused elderly female, not oriented, c/o left hip pain. [place]/AT, no clavicular tenderness or crepitance, UE's no gross bone or joint instability, good grip  strength, abdomen obese, non tender, RLE non tender and no gross bone or joint instability, Left hip held flexed with diffuse tenderness and marked pain with attempts at hip motion, grossly N/V intact BLE. Xray as noted above with L subtrochanteric femur fracture.     Assessment/Plan: Impression: Left subtrochanteric femur fracture Treatment: Will require surgical stabilization. I will discuss with my partners availability for surgical repair, and ultimately contact social services to obtain POA consent for surgery.  Raydin Bielinski M 03/20/2013, 11:00 PM        

## 2013-03-21 NOTE — Progress Notes (Signed)
INITIAL NUTRITION ASSESSMENT  DOCUMENTATION CODES Per approved criteria  -Obesity Unspecified   INTERVENTION: Supplement diet as appropriate.  NUTRITION DIAGNOSIS: Increased nutrient needs related to recent fracture as evidenced by estimated needs.   Goal: Pt to meet >/= 90% of their estimated nutrition needs   Monitor:  Diet advancement, PO intake, weight trend  Reason for Assessment: MD Consult  79 y.o. female  Admitting Dx: Subtrochanteric fracture of left femur  ASSESSMENT: Pt admitted from facility with hip fx, possible surgery later today. Pt has dementia and is unable to answer any questions reliably.   Nutrition Focused Physical Exam:  Subcutaneous Fat:  Orbital Region: WNL Upper Arm Region: WNL Thoracic and Lumbar Region: WNL  Muscle:  Temple Region: WNL  Clavicle Bone Region: WNL Clavicle and Acromion Bone Region: WNL Scapular Bone Region: WNL Dorsal Hand: WNL Patellar Region: WNl Anterior Thigh Region: WNl Posterior Calf Region: WNL   Edema: not present   Height: Ht Readings from Last 1 Encounters:  01/02/10 5' 2.5" (1.588 m)    Weight: Wt Readings from Last 1 Encounters:  03/21/13 168 lb 10.4 oz (76.5 kg)    Ideal Body Weight: 50 kg   % Ideal Body Weight: 153%  Wt Readings from Last 10 Encounters:  03/21/13 168 lb 10.4 oz (76.5 kg)  03/21/13 168 lb 10.4 oz (76.5 kg)  01/02/10 121 lb (54.885 kg)    Usual Body Weight: unknown  % Usual Body Weight: -  BMI:  Body mass index is 30.34 kg/(m^2).  Estimated Nutritional Needs: Kcal: 1500-1700 Protein: 70-80 grams Fluid: >1.5 L/day  Skin: no issues noted  Diet Order: NPO  EDUCATION NEEDS: -No education needs identified at this time   Intake/Output Summary (Last 24 hours) at 03/21/13 1359 Last data filed at 03/21/13 0400  Gross per 24 hour  Intake      0 ml  Output      0 ml  Net      0 ml    Last BM: 9/21   Labs:   Recent Labs Lab 03/20/13 2046  NA 135  K 3.9   CL 97  CO2 29  BUN 16  CREATININE 0.63  CALCIUM 8.6  GLUCOSE 137*    CBG (last 3)  No results found for this basename: GLUCAP,  in the last 72 hours  Scheduled Meds: . atorvastatin  10 mg Oral Daily  . donepezil  10 mg Oral QHS  . LORazepam  0.5 mg Oral BID  . losartan  50 mg Oral Daily  . mometasone-formoterol  2 puff Inhalation BID    Continuous Infusions:   Past Medical History  Diagnosis Date  . Hypertension   . Dementia   . Osteoporosis with fracture   . GI bleed 2011    Past Surgical History  Procedure Laterality Date  . Cholecystectomy      "distant past"    Kendell Bane RD, LDN, CNSC [phone] Pager [phone] After Hours Pager

## 2013-03-21 NOTE — Interval H&P Note (Signed)
History and Physical Interval Note:  03/21/2013 3:49 PM  Lisa Watts  has presented today for surgery, with the diagnosis of l femur fracture  The various methods of treatment have been discussed with the patient and family. After consideration of risks, benefits and other options for treatment, the patient has consented to  Procedure(s): INTRAMEDULLARY (IM) NAIL FEMORAL (Left) as a surgical intervention .  The patient's history has been reviewed, patient examined, no change in status, stable for surgery.  I have reviewed the patient's chart and labs.  Questions were answered to the patient's satisfaction.     [name],STEVEN R

## 2013-03-21 NOTE — Progress Notes (Signed)
Lisa Watts  MRN: [id number] DOB/Age: 1924-12-25 79 y.o. Physician: Jacquelyne Balint Procedure:       Subjective: In a fair amount of pain   Vital Signs Temp:  [98.1 F (36.7 C)-98.4 F (36.9 C)] 98.3 F (36.8 C) (09/22 0629) Pulse Rate:  [21-102] 91 (09/22 0629) Resp:  [17-22] 19 (09/22 0629) BP: (100-178)/(39-76) 119/43 mmHg (09/22 0629) SpO2:  [94 %-100 %] 99 % (09/22 0629) Weight:  [76.5 kg (168 lb 10.4 oz)] 76.5 kg (168 lb 10.4 oz) (09/22 0400)  Lab Results  Recent Labs  03/20/13 2046  WBC 14.7*  HGB 11.7*  HCT 35.0*  PLT 207   BMET  Recent Labs  03/20/13 2046  NA 135  K 3.9  CL 97  CO2 29  GLUCOSE 137*  BUN 16  CREATININE 0.63  CALCIUM 8.6   INR  Date Value Range Status  03/20/2013 1.19  0.00 - 1.49 Final     Exam Repositioned left leg for her        Plan Cont NPO Dr. Ranell Patrick will be assuming care for surgical management later today.  Lisa Watts for Dr.Kevin Supple 03/21/2013, 8:57 AM

## 2013-03-21 NOTE — Progress Notes (Signed)
Called Dr Ranell Patrick' office about pt being NPO no flds ordered and if surgery was being done today.

## 2013-03-21 NOTE — Anesthesia Preprocedure Evaluation (Addendum)
Anesthesia Evaluation  Patient identified by MRN, date of birth, ID band Patient awake    Reviewed: Allergy & Precautions, H&P , NPO status , Patient's Chart, lab work & pertinent test results, reviewed documented beta blocker date and time   Airway Mallampati: II TM Distance: >3 FB     Dental  (+) Edentulous Upper and Edentulous Lower   Pulmonary          Cardiovascular hypertension, Rhythm:Regular Rate:Normal     Neuro/Psych    GI/Hepatic   Endo/Other    Renal/GU      Musculoskeletal   Abdominal (+) + obese,   Peds  Hematology   Anesthesia Other Findings MRSA positive  Reproductive/Obstetrics                         Anesthesia Physical Anesthesia Plan  ASA: III  Anesthesia Plan: General   Post-op Pain Management:    Induction: Intravenous  Airway Management Planned: Oral ETT  Additional Equipment:   Intra-op Plan:   Post-operative Plan: Extubation in OR  Informed Consent: I have reviewed the patients History and Physical, chart, labs and discussed the procedure including the risks, benefits and alternatives for the proposed anesthesia with the patient or authorized representative who has indicated his/her understanding and acceptance.     Plan Discussed with: CRNA and Anesthesiologist  Anesthesia Plan Comments:         Anesthesia Quick Evaluation

## 2013-03-21 NOTE — Progress Notes (Signed)
Orthopedic Tech Progress Note Patient Details:  Lisa Watts 22-Nov-1924 [id number]  Patient ID: Marchia Bond, female   DOB: [birth year], 79 y.o.   MRN: [id number] Trapeze bar patient helper  Nikki Dom 03/21/2013, 8:19 PM

## 2013-03-21 NOTE — Anesthesia Procedure Notes (Signed)
Procedure Name: Intubation Date/Time: 03/21/2013 4:08 PM Performed by: Lovie Chol Pre-anesthesia Checklist: Patient identified, Emergency Drugs available, Suction available, Patient being monitored and Timeout performed Patient Re-evaluated:Patient Re-evaluated prior to inductionOxygen Delivery Method: Circle system utilized Preoxygenation: Pre-oxygenation with 100% oxygen Intubation Type: IV induction Ventilation: Mask ventilation without difficulty and Oral airway inserted - appropriate to patient size Laryngoscope Size: Miller and 2 Grade View: Grade I Tube type: Oral Tube size: 7.5 mm Number of attempts: 1 Airway Equipment and Method: Stylet Placement Confirmation: ETT inserted through vocal cords under direct vision,  positive ETCO2,  CO2 detector and breath sounds checked- equal and bilateral Secured at: 21 cm Tube secured with: Tape Dental Injury: Teeth and Oropharynx as per pre-operative assessment

## 2013-03-21 NOTE — Preoperative (Signed)
Beta Blockers   Reason not to administer Beta Blockers:Not Applicable 

## 2013-03-22 ENCOUNTER — Encounter (HOSPITAL_COMMUNITY): Payer: Self-pay | Admitting: Orthopedic Surgery

## 2013-03-22 DIAGNOSIS — D509 Iron deficiency anemia, unspecified: Secondary | ICD-10-CM

## 2013-03-22 DIAGNOSIS — S72009A Fracture of unspecified part of neck of unspecified femur, initial encounter for closed fracture: Secondary | ICD-10-CM

## 2013-03-22 LAB — CBC
HCT: 27.2 % — ABNORMAL LOW (ref 36.0–46.0)
Hemoglobin: 8.8 g/dL — ABNORMAL LOW (ref 12.0–15.0)
MCH: 30.7 pg (ref 26.0–34.0)
MCHC: 32.4 g/dL (ref 30.0–36.0)
MCV: 94.8 fL (ref 78.0–100.0)
Platelets: 147 10*3/uL — ABNORMAL LOW (ref 150–400)
RBC: 2.87 MIL/uL — ABNORMAL LOW (ref 3.87–5.11)

## 2013-03-22 LAB — BASIC METABOLIC PANEL
BUN: 25 mg/dL — ABNORMAL HIGH (ref 6–23)
CO2: 24 mEq/L (ref 19–32)
Creatinine, Ser: 1.17 mg/dL — ABNORMAL HIGH (ref 0.50–1.10)
GFR calc Af Amer: 47 mL/min — ABNORMAL LOW (ref >90)
Glucose, Bld: 113 mg/dL — ABNORMAL HIGH (ref 70–99)
Sodium: 139 mEq/L (ref 135–145)

## 2013-03-22 MED ORDER — SODIUM CHLORIDE 0.9 % IV SOLN: INTRAVENOUS | Status: AC

## 2013-03-22 NOTE — Progress Notes (Signed)
   Subjective: 1 Day Post-Op Procedure(s) (LRB): INTRAMEDULLARY (IM) NAIL FEMORAL (Left)  Pt awake but confused and disoriented this morning Patient reports pain as mild.  Objective:   VITALS:   Filed Vitals:   03/22/13 0500  BP: 102/67  Pulse: 89  Temp: 98 F (36.7 C)  Resp: 18    Left hip/thigh incisions healing well nv intact distally No rashes   LABS  Recent Labs  03/20/13 2046 03/21/13 2030 03/22/13 0500  HGB 11.7* 9.5* 8.8*  HCT 35.0* 29.0* 27.2*  WBC 14.7* 14.3* 10.2  PLT 207 188 147*     Recent Labs  03/20/13 2046 03/21/13 2030 03/22/13 0500  NA 135  --  139  K 3.9  --  4.7  BUN 16  --  25*  CREATININE 0.63 0.84 1.17*  GLUCOSE 137*  --  113*     Assessment/Plan: 1 Day Post-Op Procedure(s) (LRB): INTRAMEDULLARY (IM) NAIL FEMORAL (Left)  Strict non weight bearing left lower extremity D/c planning to snf dvt prophylaxis    Alphonsa Overall, MPAS, PA-C  03/22/2013, 7:37 AM

## 2013-03-22 NOTE — Evaluation (Signed)
Physical Therapy Evaluation Patient Details Name: Lisa Watts MRN: [id number] DOB: [birth year] Today's Date: 03/22/2013 Time: 2956-2130 PT Time Calculation (min): 21 min  PT Assessment / Plan / Recommendation History of Present Illness  Lisa Watts is a 79 y.o. female who presents to the ED after a fall.  Patient complaining of L hip and back pain since the fall.  She was walking and tripped over some objects on the floor (mechanical fall), no head injury, no LOC.  Patient does have fairly severe dementia with very limited short term memory (doesnt remember being told about the hip fracture a couple of hours ago in the ED and when I explained that she had a hip fracture she states "oh, is that what happened?").; Now s/p IM Nail  Clinical Impression  Patient is s/p above surgery resulting in functional limitations due to the deficits listed below (see PT Problem List).  Patient will benefit from skilled PT to increase their independence and safety with mobility to allow discharge to the venue listed below.       PT Assessment  Patient needs continued PT services    Follow Up Recommendations  SNF    Does the patient have the potential to tolerate intense rehabilitation      Barriers to Discharge        Equipment Recommendations  Rolling walker with 5" wheels;3in1 (PT);Wheelchair (measurements PT);Wheelchair cushion (measurements PT)    Recommendations for Other Services     Frequency Min 3X/week    Precautions / Restrictions Precautions Precautions: Fall Restrictions Weight Bearing Restrictions: Yes LLE Weight Bearing: Non weight bearing   Pertinent Vitals/Pain Unable to rate, but clearly in pain L hip when mobilizing patient repositioned for comfort       Mobility  Bed Mobility Bed Mobility: Supine to Sit;Sitting - Scoot to Edge of Bed Supine to Sit: 1: +2 Total assist;With rails Supine to Sit: Patient Percentage: 20% Sitting - Scoot to Edge of Bed: 2: Max  assist;With rail Details for Bed Mobility Assistance: Cues for technique, however upon initial movement towards EOB, Lisa Watts became extremely internall distracted by pain and then required total assist as she was unable to attend to commands or initiate movement Transfers Transfers: Lateral/Scoot Electrical engineer Transfers: 1: +2 Total assist Squat Pivot Transfers: Patient Percentage: 30% Lateral/Scoot Transfers: 1: +1 Total assist Details for Transfer Assistance: Lateral scoot transfers simulated by scooting on EOB towards chair, required R knee blocking; +2 total for squat pivot transfer toward R side, with bed pad cradling hips and heavy R knee blocking Ambulation/Gait Ambulation/Gait Assistance: Not tested (comment)    Exercises     PT Diagnosis: Acute pain;Difficulty walking  PT Problem List: Decreased strength;Decreased range of motion;Decreased activity tolerance;Decreased balance;Decreased mobility;Decreased cognition;Decreased knowledge of use of DME;Decreased knowledge of precautions;Pain PT Treatment Interventions: DME instruction;Gait training;Functional mobility training;Therapeutic activities;Therapeutic exercise;Balance training;Cognitive remediation;Patient/family education     PT Goals(Current goals can be found in the care plan section) Acute Rehab PT Goals Patient Stated Goal: agreeable to OOB; very focused on needing to urinate, then praying the rest of session PT Goal Formulation: Patient unable to participate in goal setting Time For Goal Achievement: 04/05/13 Potential to Achieve Goals: Fair  Visit Information  Last PT Received On: 03/22/13 Assistance Needed: +2 History of Present Illness: Lisa Watts is a 79 y.o. female who presents to the ED after a fall.  Patient complaining of L hip and back pain since the fall.  She  was walking and tripped over some objects on the floor (mechanical fall), no head injury, no LOC.  Patient does  have fairly severe dementia with very limited short term memory (doesnt remember being told about the hip fracture a couple of hours ago in the ED and when I explained that she had a hip fracture she states "oh, is that what happened?").; Now s/p IM Nail       Prior Functioning  Home Living Family/patient expects to be discharged to:: Skilled nursing facility Prior Function Level of Independence:  (Need clarification) Comments: Pt unable to give much info re: PLOF, but apparently was ambulatory, surmised from H &P Communication Communication: Other (comment);No difficulties (repeatedly praying for strength during session)    Cognition  Cognition Arousal/Alertness: Awake/alert Behavior During Therapy: Anxious Overall Cognitive Status: No family/caregiver present to determine baseline cognitive functioning Memory: Decreased short-term memory (with h/o dementia)    Extremity/Trunk Assessment Upper Extremity Assessment Upper Extremity Assessment: Generalized weakness Lower Extremity Assessment Lower Extremity Assessment: LLE deficits/detail LLE Deficits / Details: Decr AROM and strength LLE limited by pain LLE: Unable to fully assess due to pain   Balance Balance Balance Assessed: Yes Static Sitting Balance Static Sitting - Balance Support: Bilateral upper extremity supported Static Sitting - Level of Assistance: 3: Mod assist Static Sitting - Comment/# of Minutes: EOB approx 3 minutes prepping for transfer; Very anxious, and repeating, "don't let go of me"  End of Session PT - End of Session Equipment Utilized During Treatment: Gait belt (bed pad) Activity Tolerance: Patient limited by pain Patient left: in chair;with call bell/phone within reach;Other (comment) (lift pad under pt) Nurse Communication: Mobility status;Need for lift equipment  GP     Van Clines [facility] [place], [place] [phone]  03/22/2013, 11:08 AM

## 2013-03-22 NOTE — Op Note (Signed)
Lisa Watts, Lisa Watts NO.:  [id number]  MEDICAL RECORD NO.:  [id number]  LOCATION:  5N10C                        FACILITY:  [facility]  PHYSICIAN:  Almedia Balls. Ranell Patrick, M.D. DATE OF BIRTH:  26-Jun-1925  DATE OF PROCEDURE:  03/21/2013 DATE OF DISCHARGE:                              OPERATIVE REPORT   PREOPERATIVE DIAGNOSIS:  Left displaced comminuted subtrochanteric femur fracture.  POSTOPERATIVE DIAGNOSIS:  Left displaced comminuted subtrochanteric femur fracture.  PROCEDURE PERFORMED:  Open reduction and internal fixation of left displaced subtrochanteric femur fracture using DePuy Affixus IM nail, and Zimmer cerclage cable.  ATTENDING SURGEON:  Almedia Balls. Ranell Patrick, MD.  ASSISTANT:  Donnie Coffin. Dixon, PAC, who scrubbed the entire procedure and necessary for satisfactory completion of surgery.  ANESTHESIA:  General anesthesia was used.  ESTIMATED BLOOD LOSS:  200 mL.  FLUID REPLACEMENT:  1500 mL crystalloid.  INSTRUMENT COUNTS:  Correct.  COMPLICATIONS:  There were no complications.  ANTIBIOTICS:  Perioperative antibiotics including Ancef and vancomycin were given.  INDICATIONS:  The patient is an 79 year old female with a history of ground level fall, injuring her left hip.  The patient has advanced dementia and is a ward of the state.  The patient's x-rays upon arrival to the hospital demonstrated displaced comminuted subtrochanteric femur fracture.  After counseling the patient regarding the need to perform surgery to stabilize her hip, we did receive appropriate informed consent, signed from responsible party.  DESCRIPTION OF PROCEDURE:  After adequate level of anesthesia  achieved, the patient was positioned in the supine position on the fracture table. Perineal post utilized.  Right leg placed in modified lithotomy position.  Left arm draped across the patient's chest and padded appropriately.  Once the patient was secured to the table, we  applied distal traction, internal rotation on the distal femur, and this improved the reduction but did not satisfactorily bring the proximal fragment down from flexion.  We then went ahead and obtained true lateral and true AP of the hip.  Once we were sure that we can get those views, we went ahead and sterilely prepped and draped the patient's left leg using a shower drape and DuraPrep.  We then called the time-out.  We then went ahead and made a lateral incision proximal to the greater trochanter down to the fascia.  We divided the tensor fascia lata allowing the skin incision.  We next went ahead and identified our starting point for proximal entry point on the greater trochanter.  We first placed a guide wire and identified on the AP and lateral that was in appropriate position, and we then drilled with a step-cut drill. Then, we took __________ channel and tried to perform a completely closed reduction, and again, the proximal fragment really was flexed up quite a bit.  We went ahead at this point, made a separate incision down the femur right at the fracture site, dissection down through subcu tissues with the Bovie to cut the tensor fascia lata with the Bovie, then went through the vastus lateralis fascia, identifying the fracture site, was able to obtain an anatomic reduction with a crab claw clamp and hold that reduction.  We then placed the Zimmer  cerclage cable around the femur sustaining subperiosteal with our placement of the cable and then provisionally tightened that and that resulted in an anatomic reduction.  We then went ahead and passed our ball-tip guide wire to the distal femur, measured, and this was going to be a 9 mm nail because of her small canal x 34 cm in length that we measured.  We introduced the nail, fracture reamed up to a size of 11 diameter with our flexible reamers, then placed our 9 x 34 DePuy Affixus nail antegrade across fracture site, seated  appropriately.  Then, we were able to place our lag screw, which was a 95 mm lag screw into the center of the femoral head and just slightly posterior on the lateral.  Once that was secured, we secured our set screw and tightened that all the way and did not back that off as we wanted this to function as a fixed angle device.  We removed our insertion jig.  We then went ahead and tightened up and secured our cerclage cable and clipped the remaining cable off.  We then abducted the leg, obtained perfect circles and freehand, placed two 4.5 distal interlocking screws through stab incisions.  We thoroughly irrigated all incisions, obtained final x-rays in multiple planes.  We were satisfied with our hardware placement and fracture alignment.  We then closed the wounds with layered closure with 0 Vicryl for the fascia, followed by 2-0 Vicryl subcutaneous closure and staples.  Sterile compressive bandage was applied.  The patient was transported in stable condition to  recovery room.     Almedia Balls. Ranell Patrick, M.D.     SRN/MEDQ  D:  03/21/2013  T:  03/22/2013  Job:  [id number]

## 2013-03-22 NOTE — Progress Notes (Signed)
Dr Rhona Leavens called about pt's output of only 150cc no change in orders

## 2013-03-22 NOTE — Progress Notes (Signed)
Monitoting pt's output forcing flds order to leave foley in.

## 2013-03-22 NOTE — Progress Notes (Signed)
[facility] PROGRESS NOTE  Lisa Watts ZOX:[id number] DOB: 1924/08/01 DOA: 03/20/2013 PCP: No PCP Per Patient  Assessment/Plan: 1. Femur fracture 1. S/p surgery on 9/22 2. Ortho following 2. ARF 1. GFR 71->47 2. Will continue IVF and monitor closely 3. Anemia 1. Stable 4. DVT prophylaxis 1. On subQ lovenox  Code Status: Full Family Communication: Pt in room (indicate person spoken with, relationship, and if by phone, the number) Disposition Plan: Pending   Consultants:  Ortho  Procedures:  Intramedullary L femoral nail 03/21/13  HPI/Subjective: No acute events noted overnight  Objective: Filed Vitals:   03/21/13 2230 03/22/13 0015 03/22/13 0210 03/22/13 0500  BP:  106/46 94/58 102/67  Pulse:  82 90 89  Temp:  99.2 F (37.3 C) 98.8 F (37.1 C) 98 F (36.7 C)  TempSrc:    Oral  Resp:  16 16 18   Weight:      SpO2: 99% 98% 100% 100%    Intake/Output Summary (Last 24 hours) at 03/22/13 0807 Last data filed at 03/22/13 8119  Gross per 24 hour  Intake   1850 ml  Output    770 ml  Net   1080 ml   Filed Weights   03/21/13 0400  Weight: 76.5 kg (168 lb 10.4 oz)    Exam:   General:  Awake, in nad  Cardiovascular: regular, s1, s2  Respiratory: normal resp effort, no wheezing  Abdomen: soft, nondistended  Musculoskeletal: perfused, no clubbing   Data Reviewed: Basic Metabolic Panel:  Recent Labs Lab 03/20/13 2046 03/21/13 2030 03/22/13 0500  NA 135  --  139  K 3.9  --  4.7  CL 97  --  105  CO2 29  --  24  GLUCOSE 137*  --  113*  BUN 16  --  25*  CREATININE 0.63 0.84 1.17*  CALCIUM 8.6  --  7.6*   Liver Function Tests: No results found for this basename: AST, ALT, ALKPHOS, BILITOT, PROT, ALBUMIN,  in the last 168 hours No results found for this basename: LIPASE, AMYLASE,  in the last 168 hours No results found for this basename: AMMONIA,  in the last 168 hours CBC:  Recent Labs Lab 03/20/13 2046 03/21/13 2030 03/22/13 0500   WBC 14.7* 14.3* 10.2  NEUTROABS 12.4*  --   --   HGB 11.7* 9.5* 8.8*  HCT 35.0* 29.0* 27.2*  MCV 92.8 95.1 94.8  PLT 207 188 147*   Cardiac Enzymes: No results found for this basename: CKTOTAL, CKMB, CKMBINDEX, TROPONINI,  in the last 168 hours BNP (last 3 results) No results found for this basename: PROBNP,  in the last 8760 hours CBG: No results found for this basename: GLUCAP,  in the last 168 hours  Recent Results (from the past 240 hour(s))  SURGICAL PCR SCREEN     Status: Abnormal   Collection Time    03/21/13  3:10 PM      Result Value Range Status   MRSA, PCR POSITIVE (*) NEGATIVE Final   Staphylococcus aureus POSITIVE (*) NEGATIVE Final   Comment:            The Xpert SA Assay (FDA     approved for NASAL specimens     in patients over 67 years of age),     is one component of     a comprehensive surveillance     program.  Test performance has     been validated by [employer] for patients  greater     than or equal to 79 year old.     It is not intended     to diagnose infection nor to     guide or monitor treatment.     Studies: Dg Chest 1 View  03/20/2013   CLINICAL DATA:  Pain post trauma  EXAM: CHEST - 1 VIEW  COMPARISON:  March 04, 2010  FINDINGS: There is no edema or consolidation. Heart is upper normal in size with normal pulmonary vascularity. Aorta is prominent with atherosclerotic change, stable. No adenopathy. No pneumothorax. No bone lesions. There is upper thoracic levoscoliosis and thoracolumbar dextroscoliosis.  IMPRESSION: No edema or consolidation. No pneumothorax.   Electronically Signed   By: Bretta Bang   On: 03/20/2013 20:51   Dg Pelvis 1-2 Views  03/20/2013   CLINICAL DATA:  Pain post trauma  EXAM: PELVIS - 1-2 VIEW  COMPARISON:  February 28, 2010  FINDINGS: There is a comminuted fracture of the proximal femur which arises in the subtrochanteric region. There is avulsion of the lesser trochanter with medial displacement of the  lesser trochanter. No other fractures. No dislocation. There is mild symmetric narrowing of both hip joints. Bones are osteoporotic.  IMPRESSION: Left-sided subtrochanteric comminuted fracture. Avulsion of lesser trochanter.   Electronically Signed   By: Bretta Bang   On: 03/20/2013 20:55   Dg Femur Left  03/21/2013   CLINICAL DATA:  Fracture fixation.  EXAM: LEFT FEMUR - 2 VIEW; DG C-ARM 61-120 MIN  COMPARISON:  Plain films 03/20/2013.  FINDINGS: Six fluoroscopic intraoperative spot views of the left hip are provided. Images demonstrate placement of a dynamic hip screw and long IM nail with a proximal cerclage wire and 2 distal interlocking screws for fixation of a subtrochanteric fracture. No acute abnormality is identified.  IMPRESSION: ORIF left femur fracture. No acute abnormality.   Electronically Signed   By: Drusilla Kanner M.D.   On: 03/21/2013 21:16   Dg Femur Left  03/20/2013   CLINICAL DATA:  Status post fall with left leg pain.  EXAM: LEFT FEMUR - 2 VIEW  COMPARISON:  Pelvic radiographs 02/28/2010.  FINDINGS: There is an oblique, mildly comminuted and mildly displaced subtrochanteric fracture of the left femur. This demonstrates medial displacement. On the lateral view, there is moderate apex anterior angulation. There is no dislocation. The distal femur is suboptimally evaluated in the frontal examination due to the proximal injury.  IMPRESSION: Angulated and mildly displaced subtrochanteric fracture of the left femur.   Electronically Signed   By: Roxy Horseman   On: 03/20/2013 20:57   Ct Head Wo Contrast  03/20/2013   CLINICAL DATA:  Extreme leg and left hip pain status post fall.  EXAM: CT HEAD WITHOUT CONTRAST  CT CERVICAL SPINE WITHOUT CONTRAST  TECHNIQUE: Multidetector CT imaging of the head and cervical spine was performed following the standard protocol without intravenous contrast. Multiplanar CT image reconstructions of the cervical spine were also generated.  COMPARISON:  Head  CT 02/28/2010. Cervical spine CT 01/23/2010.  FINDINGS: CT HEAD FINDINGS  There is chronic atrophy and periventricular white matter disease. A small lacunar infarct in the right lentiform nucleus has developed in the interval. Chronic small vessel ischemic changes in the left thalamus are stable. There is no evidence of acute intracranial hemorrhage, mass lesion, brain edema or extra-axial fluid collection.  The visualized paranasal sinuses, mastoid air cells and middle ears are clear. The calvarium is intact.  CT CERVICAL SPINE FINDINGS  There  is no evidence of acute cervical spine fracture, traumatic subluxation or focal paraspinal abnormality. Multilevel spondylosis is again noted with progressive generalized kyphosis. The C4-5 facet joints appear ankylosed. The lung apices are clear.  IMPRESSION: CT HEAD IMPRESSION  No acute intracranial or calvarial findings. Progressive chronic small vessel ischemic changes as described.  CT CERVICAL SPINE IMPRESSION  No evidence of acute cervical spine fracture, traumatic subluxation or static signs of instability. There is multilevel spondylosis with progressive reversal of the usual cervical lordosis.   Electronically Signed   By: Roxy Horseman   On: 03/20/2013 20:07   Ct Cervical Spine Wo Contrast  03/20/2013   CLINICAL DATA:  Extreme leg and left hip pain status post fall.  EXAM: CT HEAD WITHOUT CONTRAST  CT CERVICAL SPINE WITHOUT CONTRAST  TECHNIQUE: Multidetector CT imaging of the head and cervical spine was performed following the standard protocol without intravenous contrast. Multiplanar CT image reconstructions of the cervical spine were also generated.  COMPARISON:  Head CT 02/28/2010. Cervical spine CT 01/23/2010.  FINDINGS: CT HEAD FINDINGS  There is chronic atrophy and periventricular white matter disease. A small lacunar infarct in the right lentiform nucleus has developed in the interval. Chronic small vessel ischemic changes in the left thalamus are stable.  There is no evidence of acute intracranial hemorrhage, mass lesion, brain edema or extra-axial fluid collection.  The visualized paranasal sinuses, mastoid air cells and middle ears are clear. The calvarium is intact.  CT CERVICAL SPINE FINDINGS  There is no evidence of acute cervical spine fracture, traumatic subluxation or focal paraspinal abnormality. Multilevel spondylosis is again noted with progressive generalized kyphosis. The C4-5 facet joints appear ankylosed. The lung apices are clear.  IMPRESSION: CT HEAD IMPRESSION  No acute intracranial or calvarial findings. Progressive chronic small vessel ischemic changes as described.  CT CERVICAL SPINE IMPRESSION  No evidence of acute cervical spine fracture, traumatic subluxation or static signs of instability. There is multilevel spondylosis with progressive reversal of the usual cervical lordosis.   Electronically Signed   By: Roxy Horseman   On: 03/20/2013 20:07   Dg Pelvis Portable  03/21/2013   CLINICAL DATA:  Postop left femoral IM nail fixation.  EXAM: PORTABLE PELVIS  COMPARISON:  Intraoperative radiographs same date.  FINDINGS: Patient is status post left femoral dynamic hip screw and intramedullary nail fixation of the subtrochanteric femur fracture. There is a proximal cerclage wire. The fracture demonstrates near anatomic reduction. There is no evidence of complication or new fracture.  IMPRESSION: Near anatomic reduction of the subtrochanteric left femur fracture status post fixation.   Electronically Signed   By: Roxy Horseman   On: 03/21/2013 22:26   Dg C-arm 61-120 Min  03/21/2013   CLINICAL DATA:  Fracture fixation.  EXAM: LEFT FEMUR - 2 VIEW; DG C-ARM 61-120 MIN  COMPARISON:  Plain films 03/20/2013.  FINDINGS: Six fluoroscopic intraoperative spot views of the left hip are provided. Images demonstrate placement of a dynamic hip screw and long IM nail with a proximal cerclage wire and 2 distal interlocking screws for fixation of a  subtrochanteric fracture. No acute abnormality is identified.  IMPRESSION: ORIF left femur fracture. No acute abnormality.   Electronically Signed   By: Drusilla Kanner M.D.   On: 03/21/2013 21:16    Scheduled Meds: . atorvastatin  10 mg Oral Daily  . donepezil  10 mg Oral QHS  . enoxaparin (LOVENOX) injection  40 mg Subcutaneous Q24H  . ferrous sulfate  325 mg Oral  TID PC  . influenza vac split quadrivalent PF  0.5 mL Intramuscular Tomorrow-1000  . LORazepam  0.5 mg Oral BID  . losartan  50 mg Oral Daily  . mometasone-formoterol  2 puff Inhalation BID  . pneumococcal 23 valent vaccine  0.5 mL Intramuscular Tomorrow-1000   Continuous Infusions: . sodium chloride    . lactated ringers 50 mL/hr at 03/21/13 1540    Principal Problem:   Subtrochanteric fracture of left femur  Time spent:  CHIU, STEPHEN K  [facility] Pager [phone]. If 7PM-7AM, please contact night-coverage at [email or web address], password [facility] 03/22/2013, 8:07 AM  LOS: 2 days

## 2013-03-23 ENCOUNTER — Inpatient Hospital Stay (HOSPITAL_COMMUNITY): Payer: Medicare Other

## 2013-03-23 DIAGNOSIS — F0391 Unspecified dementia with behavioral disturbance: Secondary | ICD-10-CM

## 2013-03-23 DIAGNOSIS — S7223XA Displaced subtrochanteric fracture of unspecified femur, initial encounter for closed fracture: Principal | ICD-10-CM

## 2013-03-23 LAB — CBC
Hemoglobin: 6.8 g/dL — CL (ref 12.0–15.0)
MCH: 31.3 pg (ref 26.0–34.0)
MCHC: 33.3 g/dL (ref 30.0–36.0)
Platelets: 147 10*3/uL — ABNORMAL LOW (ref 150–400)
RBC: 2.17 MIL/uL — ABNORMAL LOW (ref 3.87–5.11)
WBC: 10.3 10*3/uL (ref 4.0–10.5)

## 2013-03-23 LAB — BASIC METABOLIC PANEL
CO2: 26 mEq/L (ref 19–32)
Calcium: 6.9 mg/dL — ABNORMAL LOW (ref 8.4–10.5)
Creatinine, Ser: 1.54 mg/dL — ABNORMAL HIGH (ref 0.50–1.10)
GFR calc Af Amer: 34 mL/min — ABNORMAL LOW (ref >90)
Glucose, Bld: 101 mg/dL — ABNORMAL HIGH (ref 70–99)
Potassium: 4.1 mEq/L (ref 3.5–5.1)
Sodium: 130 mEq/L — ABNORMAL LOW (ref 135–145)

## 2013-03-23 LAB — PREPARE RBC (CROSSMATCH)

## 2013-03-23 LAB — ABO/RH: ABO/RH(D): A NEG

## 2013-03-23 LAB — GLUCOSE, CAPILLARY: Glucose-Capillary: 108 mg/dL — ABNORMAL HIGH (ref 70–99)

## 2013-03-23 MED ORDER — FUROSEMIDE 10 MG/ML IJ SOLN
20.0000 mg | Freq: Once | INTRAMUSCULAR | Status: AC
  Administered 2013-03-23: 20 mg via INTRAVENOUS
  Filled 2013-03-23: qty 2

## 2013-03-23 MED ORDER — ACETAMINOPHEN 325 MG PO TABS: 650.0000 mg | ORAL_TABLET | Freq: Once | ORAL | Status: DC

## 2013-03-23 MED ORDER — ACETAMINOPHEN 500 MG PO TABS
1000.0000 mg | ORAL_TABLET | Freq: Once | ORAL | Status: AC
  Administered 2013-03-23: 1000 mg via ORAL
  Filled 2013-03-23: qty 2

## 2013-03-23 MED ORDER — ALBUTEROL SULFATE (5 MG/ML) 0.5% IN NEBU
2.5000 mg | INHALATION_SOLUTION | Freq: Three times a day (TID) | RESPIRATORY_TRACT | Status: DC
  Administered 2013-03-23 – 2013-03-25 (×5): 2.5 mg via RESPIRATORY_TRACT
  Filled 2013-03-23 (×5): qty 0.5

## 2013-03-23 MED ORDER — DIPHENHYDRAMINE HCL 50 MG/ML IJ SOLN
25.0000 mg | Freq: Once | INTRAMUSCULAR | Status: AC
  Administered 2013-03-23: 17:00:00 via INTRAVENOUS
  Filled 2013-03-23: qty 1

## 2013-03-23 MED ORDER — MUPIROCIN 2 % EX OINT
TOPICAL_OINTMENT | Freq: Two times a day (BID) | CUTANEOUS | Status: DC
  Administered 2013-03-23 – 2013-03-25 (×4): via NASAL
  Filled 2013-03-23: qty 22

## 2013-03-23 MED ORDER — CHLORHEXIDINE GLUCONATE CLOTH 2 % EX PADS
6.0000 | MEDICATED_PAD | Freq: Every day | CUTANEOUS | Status: DC
  Administered 2013-03-23 – 2013-03-24 (×2): 6 via TOPICAL

## 2013-03-23 MED ORDER — SENNA 8.6 MG PO TABS
2.0000 | ORAL_TABLET | Freq: Every day | ORAL | Status: DC
  Administered 2013-03-23 – 2013-03-24 (×2): 17.2 mg via ORAL
  Filled 2013-03-23 (×3): qty 2

## 2013-03-23 MED ORDER — TRAMADOL HCL 50 MG PO TABS
50.0000 mg | ORAL_TABLET | Freq: Four times a day (QID) | ORAL | Status: DC | PRN
  Administered 2013-03-25 (×2): 50 mg via ORAL
  Filled 2013-03-23 (×2): qty 1

## 2013-03-23 MED ORDER — POLYETHYLENE GLYCOL 3350 17 G PO PACK
17.0000 g | PACK | Freq: Two times a day (BID) | ORAL | Status: DC
  Administered 2013-03-23 – 2013-03-25 (×3): 17 g via ORAL
  Filled 2013-03-23 (×6): qty 1

## 2013-03-23 MED ORDER — ALBUTEROL SULFATE (5 MG/ML) 0.5% IN NEBU
2.5000 mg | INHALATION_SOLUTION | RESPIRATORY_TRACT | Status: DC | PRN
  Filled 2013-03-23: qty 0.5

## 2013-03-23 NOTE — Progress Notes (Signed)
Physical Therapy Treatment Patient Details Name: Lisa Watts MRN: [id number] DOB: 1925-02-13 Today's Date: 03/23/2013 Time: 0865-7846 PT Time Calculation (min): 26 min  PT Assessment / Plan / Recommendation  History of Present Illness Lisa Watts is a 79 y.o. female who presents to the ED after a fall.  Patient complaining of L hip and back pain since the fall.  She was walking and tripped over some objects on the floor (mechanical fall), no head injury, no LOC.  Patient does have fairly severe dementia with very limited short term memory (doesnt remember being told about the hip fracture a couple of hours ago in the ED and when I explained that she had a hip fracture she states "oh, is that what happened?").; Now s/p IM Nail   PT Comments   Noted improvements in activity tolerance and sitting balance  Follow Up Recommendations  SNF     Does the patient have the potential to tolerate intense rehabilitation     Barriers to Discharge        Equipment Recommendations  Rolling walker with 5" wheels;3in1 (PT);Wheelchair (measurements PT);Wheelchair cushion (measurements PT)    Recommendations for Other Services    Frequency Min 3X/week   Progress towards PT Goals Progress towards PT goals: Progressing toward goals  Plan Current plan remains appropriate    Precautions / Restrictions Precautions Precautions: Fall Restrictions Weight Bearing Restrictions: Yes LLE Weight Bearing: Non weight bearing   Pertinent Vitals/Pain Exquisite pain with moving; Unable to rate, but yelling; RN provided medication to assist with pain control patient repositioned for comfort     Mobility  Bed Mobility Bed Mobility: Supine to Sit;Sitting - Scoot to Edge of Bed Supine to Sit: 1: +2 Total assist;With rails Supine to Sit: Patient Percentage: 20% Sitting - Scoot to Edge of Bed: 2: Max assist;With rail Details for Bed Mobility Assistance: Cues for technique, which pt was able to initially  follow, like reaching for bed rails in rep for getting up; however upon initial movement towards EOB, Lisa Watts became extremely internall distracted by pain and then required total assist as she was unable to attend to commands or initiate movement; after settling down at EOB she was able to attend to commands related to laying down Ambulation/Gait Ambulation/Gait Assistance: Not tested (comment)    Exercises     PT Diagnosis:    PT Problem List:   PT Treatment Interventions:     PT Goals (current goals can now be found in the care plan section) Acute Rehab PT Goals Patient Stated Goal: agreeable to OOB PT Goal Formulation: Patient unable to participate in goal setting Time For Goal Achievement: 04/05/13 Potential to Achieve Goals: Fair  Visit Information  Last PT Received On: 03/23/13 Assistance Needed: +2 History of Present Illness: Lisa Watts is a 78 y.o. female who presents to the ED after a fall.  Patient complaining of L hip and back pain since the fall.  She was walking and tripped over some objects on the floor (mechanical fall), no head injury, no LOC.  Patient does have fairly severe dementia with very limited short term memory (doesnt remember being told about the hip fracture a couple of hours ago in the ED and when I explained that she had a hip fracture she states "oh, is that what happened?").; Now s/p IM Nail    Subjective Data  Subjective: requesting to get OOB Patient Stated Goal: agreeable to OOB   Cognition  Cognition Arousal/Alertness: Awake/alert Behavior  During Therapy: Anxious Overall Cognitive Status: No family/caregiver present to determine baseline cognitive functioning Memory: Decreased short-term memory (with h/o dementia)    Balance  Balance Balance Assessed: Yes Static Sitting Balance Static Sitting - Balance Support: Right upper extremity supported;Left upper extremity supported Static Sitting - Level of Assistance: 3: Mod assist;4: Min  assist;5: Stand by assistance Static Sitting - Comment/# of Minutes: Initially mod assist for balance, then once settled EOB, she was able to go to one arm support and stand-by assist (close guard); Much better sitting tolerance today (approx 6-7 minutes)  End of Session PT - End of Session Equipment Utilized During Treatment:  (bed pad) Activity Tolerance: Patient limited by pain Patient left: with call bell/phone within reach;Other (comment);in bed (IV team ) Nurse Communication: Mobility status;Need for lift equipment   GP     Van Clines [facility]  [place], [place] [phone]  03/23/2013, 5:12 PM

## 2013-03-23 NOTE — Progress Notes (Signed)
   Subjective: 2 Days Post-Op Procedure(s) (LRB): INTRAMEDULLARY (IM) NAIL FEMORAL (Left)  Pt awake and alert but disoriented Patient reports pain as mild.  Objective:   VITALS:   Filed Vitals:   03/23/13 0544  BP: 118/53  Pulse: 80  Temp: 97.5 F (36.4 C)  Resp: 18    Left hip and thigh incisions healing appropriately nv intact distally No rashes or drainage  LABS  Recent Labs  03/20/13 2046 03/21/13 2030 03/22/13 0500  HGB 11.7* 9.5* 8.8*  HCT 35.0* 29.0* 27.2*  WBC 14.7* 14.3* 10.2  PLT 207 188 147*     Recent Labs  03/20/13 2046 03/21/13 2030 03/22/13 0500  NA 135  --  139  K 3.9  --  4.7  BUN 16  --  25*  CREATININE 0.63 0.84 1.17*  GLUCOSE 137*  --  113*     Assessment/Plan: 2 Days Post-Op Procedure(s) (LRB): INTRAMEDULLARY (IM) NAIL FEMORAL (Left)  Strict non weight bearing left lower extremity Pain control as needed D/c planning - stable from orthopedic standpoint for d/c once medically stable dvt prophylaxis   Alphonsa Overall, MPAS, PA-C  03/23/2013, 9:37 AM

## 2013-03-23 NOTE — Progress Notes (Signed)
Orthopedics Progress Note  Subjective: Patient pretty limited today by pain and nausea.  Objective:  Filed Vitals:   03/23/13 1300  BP: 112/46  Pulse: 92  Temp: 98.8 F (37.1 C)  Resp:     General: Awake and alert  Musculoskeletal: Left thigh swollen but compartments supple, pain with PROM, no pain with ankle pumps Neurovascularly intact  Lab Results  Component Value Date   WBC 10.3 03/23/2013   HGB 6.8* 03/23/2013   HCT 20.4* 03/23/2013   MCV 94.0 03/23/2013   PLT 147* 03/23/2013       Component Value Date/Time   NA 130* 03/23/2013 0905   K 4.1 03/23/2013 0905   CL 98 03/23/2013 0905   CO2 26 03/23/2013 0905   GLUCOSE 101* 03/23/2013 0905   BUN 33* 03/23/2013 0905   CREATININE 1.54* 03/23/2013 0905   CALCIUM 6.9* 03/23/2013 0905   GFRNONAA 29* 03/23/2013 0905   GFRAA 34* 03/23/2013 0905    Lab Results  Component Value Date   INR 1.19 03/20/2013    Assessment/Plan: POD #2 s/p Procedure(s): INTRAMEDULLARY (IM) NAIL FEMORAL Acute blood loss anemia, transfusion ordered this AM still pending due to access issues, may need PICC line Should start any time now. Diet - full liquids Continue PT,OT will need SNF  Almedia Balls. Ranell Patrick, MD 03/23/2013 5:43 PM

## 2013-03-23 NOTE — Progress Notes (Signed)
PATIENT DETAILS Name: Lisa Watts Age: 79 y.o. Sex: female Date of Birth: 1925/02/06 Admit Date: 03/20/2013 Admitting Physician Hillary Bow, DO PCP:No PCP Per Patient  Subjective: Anxious, no major complaints  Assessment/Plan: Principal Problem:   Subtrochanteric fracture of left femur -Post intramedullary nail fixation on 9/22 - Avoid narcotics as much as possible, place on scheduled Tylenol - Rest Per orthopedics  Anemia - Secondary to perioperative blood loss - Transfuse 2 units of PRBC, recheck CBC in a.m.  Acute renal failure - Gently hydrate , recheck electrolytes in a.m.  Dyslipidemia - Continue with statin  Dementia - Mild delirium - Continue with Aricept - Continue lorazepam 0.5 mg twice a day  History of COPD - Started scheduled albuterol, needs incentive spirometry flutter valve and mobilization  Disposition: Remain inpatient-SNF in the next 1-2 days  DVT Prophylaxis: Prophylactic Lovenox   Code Status: Full code   Family Communication None at bedside  Procedures:  Hip repair 9/22  CONSULTS:  orthopedic surgery   MEDICATIONS: Scheduled Meds: . acetaminophen  1,000 mg Oral Once  . albuterol  2.5 mg Nebulization Q8H  . atorvastatin  10 mg Oral Daily  . Chlorhexidine Gluconate Cloth  6 each Topical Q0600  . diphenhydrAMINE  25 mg Intravenous Once  . donepezil  10 mg Oral QHS  . enoxaparin (LOVENOX) injection  40 mg Subcutaneous Q24H  . ferrous sulfate  325 mg Oral TID PC  . furosemide  20 mg Intravenous Once  . LORazepam  0.5 mg Oral BID  . mometasone-formoterol  2 puff Inhalation BID  . mupirocin ointment   Nasal BID   Continuous Infusions: . lactated ringers 50 mL/hr at 03/23/13 0628   PRN Meds:.albuterol, menthol-cetylpyridinium, metoCLOPramide (REGLAN) injection, metoCLOPramide, morphine injection, morphine injection, ondansetron (ZOFRAN) IV, ondansetron, phenol, promethazine, traMADol  Antibiotics: Anti-infectives    Start     Dose/Rate Route Frequency Ordered Stop   03/22/13 0600  vancomycin (VANCOCIN) IVPB 1000 mg/200 mL premix     1,000 mg 200 mL/hr over 60 Minutes Intravenous Every 12 hours 03/21/13 1956 03/22/13 0621   03/21/13 2200  vancomycin (VANCOCIN) 1,000 mg in sodium chloride 0.9 % 250 mL IVPB  Status:  Discontinued     1,000 mg 250 mL/hr over 60 Minutes Intravenous Every 12 hours 03/21/13 1758 03/21/13 1956   03/21/13 1630  ceFAZolin (ANCEF) IVPB 2 g/50 mL premix     2 g 100 mL/hr over 30 Minutes Intravenous  Once 03/21/13 1623 03/21/13 1605       PHYSICAL EXAM: Vital signs in last 24 hours: Filed Vitals:   03/23/13 0800 03/23/13 0838 03/23/13 1115 03/23/13 1300  BP: 106/60  115/66 112/46  Pulse: 80  90 92  Temp: 97.1 F (36.2 C)   98.8 F (37.1 C)  TempSrc:    Oral  Resp: 11  11   Weight:      SpO2: 100% 100% 100% 100%    Weight change:  Filed Weights   03/21/13 0400  Weight: 76.5 kg (168 lb 10.4 oz)   Body mass index is 30.34 kg/(m^2).   Gen Exam: Awake and somewhat alert Neck: Supple, No JVD.   Chest: B/L Clear.  Few bibasilar rales CVS: S1 S2 Regular, no murmurs.  Abdomen: soft, BS +, non tender, non distended.  Extremities: no edema, lower extremities warm to touch. Neurologic: Non Focal.   Skin: No Rash.   Wounds: N/A.    Intake/Output from previous day:  Intake/Output Summary (Last 24 hours) at  03/23/13 1543 Last data filed at 03/23/13 1610  Gross per 24 hour  Intake   2500 ml  Output    450 ml  Net   2050 ml     LAB RESULTS: CBC  Recent Labs Lab 03/20/13 2046 03/21/13 2030 03/22/13 0500 03/23/13 0905  WBC 14.7* 14.3* 10.2 10.3  HGB 11.7* 9.5* 8.8* 6.8*  HCT 35.0* 29.0* 27.2* 20.4*  PLT 207 188 147* 147*  MCV 92.8 95.1 94.8 94.0  MCH 31.0 31.1 30.7 31.3  MCHC 33.4 32.8 32.4 33.3  RDW 13.2 13.6 13.7 13.5  LYMPHSABS 1.3  --   --   --   MONOABS 0.9  --   --   --   EOSABS 0.0  --   --   --   BASOSABS 0.0  --   --   --     Chemistries    Recent Labs Lab 03/20/13 2046 03/21/13 2030 03/22/13 0500 03/23/13 0905  NA 135  --  139 130*  K 3.9  --  4.7 4.1  CL 97  --  105 98  CO2 29  --  24 26  GLUCOSE 137*  --  113* 101*  BUN 16  --  25* 33*  CREATININE 0.63 0.84 1.17* 1.54*  CALCIUM 8.6  --  7.6* 6.9*    CBG:  Recent Labs Lab 03/23/13 1150  GLUCAP 108*    GFR The CrCl is unknown because both a height and weight (above a minimum accepted value) are required for this calculation.  Coagulation profile  Recent Labs Lab 03/20/13 2046  INR 1.19    Cardiac Enzymes No results found for this basename: CK, CKMB, TROPONINI, MYOGLOBIN,  in the last 168 hours  No components found with this basename: POCBNP,  No results found for this basename: DDIMER,  in the last 72 hours No results found for this basename: HGBA1C,  in the last 72 hours No results found for this basename: CHOL, HDL, LDLCALC, TRIG, CHOLHDL, LDLDIRECT,  in the last 72 hours No results found for this basename: TSH, T4TOTAL, FREET3, T3FREE, THYROIDAB,  in the last 72 hours No results found for this basename: VITAMINB12, FOLATE, FERRITIN, TIBC, IRON, RETICCTPCT,  in the last 72 hours No results found for this basename: LIPASE, AMYLASE,  in the last 72 hours  Urine Studies No results found for this basename: UACOL, UAPR, USPG, UPH, UTP, UGL, UKET, UBIL, UHGB, UNIT, UROB, ULEU, UEPI, UWBC, URBC, UBAC, CAST, CRYS, UCOM, BILUA,  in the last 72 hours  MICROBIOLOGY: Recent Results (from the past 240 hour(s))  SURGICAL PCR SCREEN     Status: Abnormal   Collection Time    03/21/13  3:10 PM      Result Value Range Status   MRSA, PCR POSITIVE (*) NEGATIVE Final   Staphylococcus aureus POSITIVE (*) NEGATIVE Final   Comment:            The Xpert SA Assay (FDA     approved for NASAL specimens     in patients over 7 years of age),     is one component of     a comprehensive surveillance     program.  Test performance has     been validated by [employer] for patients greater     than or equal to 46 year old.     It is not intended     to diagnose infection nor to  guide or monitor treatment.    RADIOLOGY STUDIES/RESULTS: Dg Chest 1 View  03/20/2013   CLINICAL DATA:  Pain post trauma  EXAM: CHEST - 1 VIEW  COMPARISON:  March 04, 2010  FINDINGS: There is no edema or consolidation. Heart is upper normal in size with normal pulmonary vascularity. Aorta is prominent with atherosclerotic change, stable. No adenopathy. No pneumothorax. No bone lesions. There is upper thoracic levoscoliosis and thoracolumbar dextroscoliosis.  IMPRESSION: No edema or consolidation. No pneumothorax.   Electronically Signed   By: Bretta Bang   On: 03/20/2013 20:51   Dg Pelvis 1-2 Views  03/20/2013   CLINICAL DATA:  Pain post trauma  EXAM: PELVIS - 1-2 VIEW  COMPARISON:  February 28, 2010  FINDINGS: There is a comminuted fracture of the proximal femur which arises in the subtrochanteric region. There is avulsion of the lesser trochanter with medial displacement of the lesser trochanter. No other fractures. No dislocation. There is mild symmetric narrowing of both hip joints. Bones are osteoporotic.  IMPRESSION: Left-sided subtrochanteric comminuted fracture. Avulsion of lesser trochanter.   Electronically Signed   By: Bretta Bang   On: 03/20/2013 20:55   Dg Femur Left  03/21/2013   CLINICAL DATA:  Fracture fixation.  EXAM: LEFT FEMUR - 2 VIEW; DG C-ARM 61-120 MIN  COMPARISON:  Plain films 03/20/2013.  FINDINGS: Six fluoroscopic intraoperative spot views of the left hip are provided. Images demonstrate placement of a dynamic hip screw and long IM nail with a proximal cerclage wire and 2 distal interlocking screws for fixation of a subtrochanteric fracture. No acute abnormality is identified.  IMPRESSION: ORIF left femur fracture. No acute abnormality.   Electronically Signed   By: Drusilla Kanner M.D.   On: 03/21/2013 21:16   Dg Femur  Left  03/20/2013   CLINICAL DATA:  Status post fall with left leg pain.  EXAM: LEFT FEMUR - 2 VIEW  COMPARISON:  Pelvic radiographs 02/28/2010.  FINDINGS: There is an oblique, mildly comminuted and mildly displaced subtrochanteric fracture of the left femur. This demonstrates medial displacement. On the lateral view, there is moderate apex anterior angulation. There is no dislocation. The distal femur is suboptimally evaluated in the frontal examination due to the proximal injury.  IMPRESSION: Angulated and mildly displaced subtrochanteric fracture of the left femur.   Electronically Signed   By: Roxy Horseman   On: 03/20/2013 20:57   Ct Head Wo Contrast  03/20/2013   CLINICAL DATA:  Extreme leg and left hip pain status post fall.  EXAM: CT HEAD WITHOUT CONTRAST  CT CERVICAL SPINE WITHOUT CONTRAST  TECHNIQUE: Multidetector CT imaging of the head and cervical spine was performed following the standard protocol without intravenous contrast. Multiplanar CT image reconstructions of the cervical spine were also generated.  COMPARISON:  Head CT 02/28/2010. Cervical spine CT 01/23/2010.  FINDINGS: CT HEAD FINDINGS  There is chronic atrophy and periventricular white matter disease. A small lacunar infarct in the right lentiform nucleus has developed in the interval. Chronic small vessel ischemic changes in the left thalamus are stable. There is no evidence of acute intracranial hemorrhage, mass lesion, brain edema or extra-axial fluid collection.  The visualized paranasal sinuses, mastoid air cells and middle ears are clear. The calvarium is intact.  CT CERVICAL SPINE FINDINGS  There is no evidence of acute cervical spine fracture, traumatic subluxation or focal paraspinal abnormality. Multilevel spondylosis is again noted with progressive generalized kyphosis. The C4-5 facet joints appear ankylosed. The lung apices are clear.  IMPRESSION: CT HEAD IMPRESSION  No acute intracranial or calvarial findings. Progressive  chronic small vessel ischemic changes as described.  CT CERVICAL SPINE IMPRESSION  No evidence of acute cervical spine fracture, traumatic subluxation or static signs of instability. There is multilevel spondylosis with progressive reversal of the usual cervical lordosis.   Electronically Signed   By: Roxy Horseman   On: 03/20/2013 20:07   Ct Cervical Spine Wo Contrast  03/20/2013   CLINICAL DATA:  Extreme leg and left hip pain status post fall.  EXAM: CT HEAD WITHOUT CONTRAST  CT CERVICAL SPINE WITHOUT CONTRAST  TECHNIQUE: Multidetector CT imaging of the head and cervical spine was performed following the standard protocol without intravenous contrast. Multiplanar CT image reconstructions of the cervical spine were also generated.  COMPARISON:  Head CT 02/28/2010. Cervical spine CT 01/23/2010.  FINDINGS: CT HEAD FINDINGS  There is chronic atrophy and periventricular white matter disease. A small lacunar infarct in the right lentiform nucleus has developed in the interval. Chronic small vessel ischemic changes in the left thalamus are stable. There is no evidence of acute intracranial hemorrhage, mass lesion, brain edema or extra-axial fluid collection.  The visualized paranasal sinuses, mastoid air cells and middle ears are clear. The calvarium is intact.  CT CERVICAL SPINE FINDINGS  There is no evidence of acute cervical spine fracture, traumatic subluxation or focal paraspinal abnormality. Multilevel spondylosis is again noted with progressive generalized kyphosis. The C4-5 facet joints appear ankylosed. The lung apices are clear.  IMPRESSION: CT HEAD IMPRESSION  No acute intracranial or calvarial findings. Progressive chronic small vessel ischemic changes as described.  CT CERVICAL SPINE IMPRESSION  No evidence of acute cervical spine fracture, traumatic subluxation or static signs of instability. There is multilevel spondylosis with progressive reversal of the usual cervical lordosis.   Electronically Signed    By: Roxy Horseman   On: 03/20/2013 20:07   Dg Pelvis Portable  03/21/2013   CLINICAL DATA:  Postop left femoral IM nail fixation.  EXAM: PORTABLE PELVIS  COMPARISON:  Intraoperative radiographs same date.  FINDINGS: Patient is status post left femoral dynamic hip screw and intramedullary nail fixation of the subtrochanteric femur fracture. There is a proximal cerclage wire. The fracture demonstrates near anatomic reduction. There is no evidence of complication or new fracture.  IMPRESSION: Near anatomic reduction of the subtrochanteric left femur fracture status post fixation.   Electronically Signed   By: Roxy Horseman   On: 03/21/2013 22:26   Dg Chest Port 1 View  03/23/2013   *RADIOLOGY REPORT*  Clinical Data: Shortness of breath, subsequent encounter.  PORTABLE CHEST - 1 VIEW  Comparison: 03/20/2013; 03/04/2010  Findings: Grossly unchanged enlarged cardiac silhouette and mediastinal contours.  Worsening left basilar/retrocardiac heterogeneous / consolidative opacities.  Mild pulmonary congestion without frank evidence of edema.  Trace left-sided pleural effusion is not excluded.  No pneumothorax.  Grossly unchanged bones.  IMPRESSION: 1.  Worsening left basilar opacities, possibly atelectasis though developing infection is not excluded. Further evaluation with a PA and lateral chest radiograph may be obtained as clinically indicated. 2.  Cardiomegaly and pulmonary congestion without frank evidence of edema.   Original Report Authenticated By: Tacey Ruiz, MD   Dg C-arm [phone] Min  03/21/2013   CLINICAL DATA:  Fracture fixation.  EXAM: LEFT FEMUR - 2 VIEW; DG C-ARM 61-120 MIN  COMPARISON:  Plain films 03/20/2013.  FINDINGS: Six fluoroscopic intraoperative spot views of the left hip are provided. Images demonstrate placement of a dynamic  hip screw and long IM nail with a proximal cerclage wire and 2 distal interlocking screws for fixation of a subtrochanteric fracture. No acute abnormality is identified.   IMPRESSION: ORIF left femur fracture. No acute abnormality.   Electronically Signed   By: Drusilla Kanner M.D.   On: 03/21/2013 21:16    Jeoffrey Massed, MD  [facility] Pager:[phone]  If 7PM-7AM, please contact night-coverage [email or web address] Password [facility] 03/23/2013, 3:43 PM   LOS: 3 days

## 2013-03-23 NOTE — Evaluation (Signed)
Occupational Therapy Evaluation Patient Details Name: Lisa Watts MRN: [id number] DOB: 24-Dec-1924 Today's Date: 03/23/2013 Time: 4098-1191 OT Time Calculation (min): 20 min  OT Assessment / Plan / Recommendation History of present illness Lisa Watts is a 79 y.o. female who presents to the ED after a fall.  Patient complaining of L hip and back pain since the fall.  She was walking and tripped over some objects on the floor (mechanical fall), no head injury, no LOC.  Patient does have fairly severe dementia with very limited short term memory (doesnt remember being told about the hip fracture a couple of hours ago in the ED and when I explained that she had a hip fracture she states "oh, is that what happened?").; Now s/p IM Nail   Clinical Impression   Pt admitted with above. Will continue to follow acutely in order to address below problem list. Recommending d/c to SNF.    OT Assessment  Patient needs continued OT Services    Follow Up Recommendations  SNF;Supervision/Assistance - 24 hour    Barriers to Discharge      Equipment Recommendations  3 in 1 bedside comode    Recommendations for Other Services    Frequency  Min 2X/week    Precautions / Restrictions Precautions Precautions: Fall Restrictions Weight Bearing Restrictions: Yes LLE Weight Bearing: Non weight bearing   Pertinent Vitals/Pain See vitals    ADL  Grooming: Performed;Wash/dry face;Moderate assistance Where Assessed - Grooming: Unsupported sitting Upper Body Bathing: Simulated;Maximal assistance Where Assessed - Upper Body Bathing: Supine, head of bed up Lower Body Bathing: Simulated;+1 Total assistance Where Assessed - Lower Body Bathing: Supine, head of bed up Upper Body Dressing: Performed;Maximal assistance Where Assessed - Upper Body Dressing: Supine, head of bed up Lower Body Dressing: Performed;+1 Total assistance Where Assessed - Lower Body Dressing: Supine, head of bed  up Transfers/Ambulation Related to ADLs: RN requesting pt return to supine in bed at end of session for IV team. ADL Comments: Pt very painful upon OT/PT arrival and requiring +2 total assist for all aspects of bed mobility.  Bil UEs very tremulous due to pain while supine in bed but less tremulous once sitting up EOB. Pt reporting she felt much better sitting up.  Mod assist with hand over hand to wash face.  While performing bed mobility, noted that pt was bleeding from RUE, notified RN who was present at bedside end of session.    OT Diagnosis: Generalized weakness;Acute pain  OT Problem List: Decreased strength;Decreased activity tolerance;Impaired balance (sitting and/or standing);Decreased knowledge of use of DME or AE;Decreased knowledge of precautions;Pain OT Treatment Interventions: Self-care/ADL training;DME and/or AE instruction;Therapeutic activities;Patient/family education;Balance training   OT Goals(Current goals can be found in the care plan section) Acute Rehab OT Goals Patient Stated Goal: agreeable to OOB OT Goal Formulation: With patient Time For Goal Achievement: 04/06/13 Potential to Achieve Goals: Good  Visit Information  Last OT Received On: 03/23/13 Assistance Needed: +2 History of Present Illness: Lisa Watts is a 79 y.o. female who presents to the ED after a fall.  Patient complaining of L hip and back pain since the fall.  She was walking and tripped over some objects on the floor (mechanical fall), no head injury, no LOC.  Patient does have fairly severe dementia with very limited short term memory (doesnt remember being told about the hip fracture a couple of hours ago in the ED and when I explained that she had a hip fracture she  states "oh, is that what happened?").; Now s/p IM Nail       Prior Functioning     Home Living Family/patient expects to be discharged to:: Skilled nursing facility Prior Function Comments: Pt unable to give much info re: PLOF,  but apparently was ambulatory, surmised from H &P         Vision/Perception     Cognition  Cognition Arousal/Alertness: Awake/alert Behavior During Therapy: Anxious Overall Cognitive Status: No family/caregiver present to determine baseline cognitive functioning Memory: Decreased short-term memory (with h/o dementia)    Extremity/Trunk Assessment Upper Extremity Assessment Upper Extremity Assessment: Generalized weakness     Mobility Bed Mobility Bed Mobility: Supine to Sit;Sitting - Scoot to [place] of Bed Rolling Right: 1: +2 Total assist Rolling Right: Patient Percentage: 0% Supine to Sit: 1: +2 Total assist;With rails Supine to Sit: Patient Percentage: 20% Sitting - Scoot to Edge of Bed: 2: Max assist;With rail Sit to Supine: 1: +2 Total assist;HOB flat Sit to Supine: Patient Percentage: 10% Details for Bed Mobility Assistance: Cues for technique, which pt was able to initially follow, like reaching for bed rails in rep for getting up; however upon initial movement towards EOB, Lisa Watts became extremely internall distracted by pain and then required total assist as she was unable to attend to commands or initiate movement; after settling down at EOB she was able to attend to commands related to laying down     Exercise     Balance Balance Balance Assessed: Yes Static Sitting Balance Static Sitting - Balance Support: Right upper extremity supported;Left upper extremity supported Static Sitting - Level of Assistance: 3: Mod assist;4: Min assist;5: Stand by assistance Static Sitting - Comment/# of Minutes: Initially mod assist for balance, then once settled EOB, she was able to go to one arm support and stand-by assist (close guard); Much better sitting tolerance today (approx 6-7 minutes)   End of Session OT - End of Session Equipment Utilized During Treatment: Oxygen Activity Tolerance: Patient limited by pain Patient left: in bed;with nursing/sitter in room Nurse  Communication: Mobility status  GO    03/23/2013 Cipriano Mile OTR/L Pager [phone] Office [phone]  Cipriano Mile 03/23/2013, 5:14 PM

## 2013-03-24 DIAGNOSIS — D62 Acute posthemorrhagic anemia: Secondary | ICD-10-CM

## 2013-03-24 DIAGNOSIS — J449 Chronic obstructive pulmonary disease, unspecified: Secondary | ICD-10-CM

## 2013-03-24 LAB — TYPE AND SCREEN
ABO/RH(D): A NEG
Antibody Screen: NEGATIVE
Unit division: 0

## 2013-03-24 LAB — CBC
Hemoglobin: 9 g/dL — ABNORMAL LOW (ref 12.0–15.0)
MCH: 31 pg (ref 26.0–34.0)
MCV: 90.3 fL (ref 78.0–100.0)
RBC: 2.9 MIL/uL — ABNORMAL LOW (ref 3.87–5.11)
WBC: 9.7 10*3/uL (ref 4.0–10.5)

## 2013-03-24 LAB — BASIC METABOLIC PANEL
CO2: 28 mEq/L (ref 19–32)
Calcium: 7.2 mg/dL — ABNORMAL LOW (ref 8.4–10.5)
Chloride: 100 mEq/L (ref 96–112)
Creatinine, Ser: 0.73 mg/dL (ref 0.50–1.10)
Glucose, Bld: 99 mg/dL (ref 70–99)

## 2013-03-24 MED ORDER — ENSURE COMPLETE PO LIQD
237.0000 mL | Freq: Two times a day (BID) | ORAL | Status: DC
  Administered 2013-03-24 – 2013-03-25 (×2): 237 mL via ORAL

## 2013-03-24 NOTE — Clinical Social Work Psychosocial (Addendum)
Clinical Social Work Department  BRIEF PSYCHOSOCIAL ASSESSMENT  Patient: Lisa Watts  Account Number: [id number]   Admit date: 03/20/13 Clinical Social Worker Sabino Niemann, MSW Date/Time: 03/24/13 Referred by: Physician Date Referred: 03/23/13 Referred for   SNF Placement   Other Referral:  Interview type: Patient's Gaurdian Melissa Price from DSS Other interview type: PSYCHOSOCIAL DATA  Living Status: ALF Admitted from facility: [facility]  Level of care: ALF Primary support name: Barnie Alderman Primary support relationship to patient: DSS Guardian Degree of support available:  Strong and vested  CURRENT CONCERNS  Current Concerns   Post-Acute Placement   Other Concerns:  SOCIAL WORK ASSESSMENT / PLAN  CSW spoke with patient's guardian re: PT recommendation for SNF.   Pt lives at [facility]   CSW explained placement process and answered questions.   Pt's guardian reports [facility]  as her preference    CSW completed FL2 and initiated SNF search.     Assessment/plan status: Information/Referral to [facility]  Other assessment/ plan:  Information/referral to community resources:  SNF   PTAR  PATIENT'S/FAMILY'S RESPONSE TO PLAN OF CARE:  Pt's guardian is agreeable to the patient going to [facility] SN[facility] in order to increase strength and independence with mobility prior to returning home  Pt's guardian verbalized understanding of placement process and appreciation for CSW assist.   Sabino Niemann, MSW [phone]

## 2013-03-24 NOTE — Progress Notes (Signed)
PATIENT DETAILS Name: Lisa Watts Age: 79 y.o. Sex: female Date of Birth: February 17, 1925 Admit Date: 03/20/2013 Admitting Physician Hillary Bow, DO PCP:No PCP Per Patient  Subjective: Seems to be much relaxed than yesterday, breathing better-trying to pull out her wound dressing  Assessment/Plan: Principal Problem:   Subtrochanteric fracture of left femur -Post intramedullary nail fixation on 9/22 - Avoid narcotics as much as possible, place on scheduled Tylenol, if needed use tramadol - Rest Per orthopedics  Anemia - Secondary to perioperative blood loss - Transfused 2 units of PRBC on 9/24, Hb appropriately increased to 9.0  Acute renal failure - Gently hydrate , await lytes -good urine output-d/c foley  Dyslipidemia - Continue with statin  Dementia - Mild delirium - Continue with Aricept - Continue lorazepam 0.5 mg twice a day  History of COPD - Started scheduled albuterol, needs incentive spirometry flutter valve and mobilization  Disposition: Remain inpatient-SNF 9/26  DVT Prophylaxis: Prophylactic Lovenox   Code Status: Full code   Family Communication None at bedside  Procedures:  Hip repair 9/22  CONSULTS:  orthopedic surgery   MEDICATIONS: Scheduled Meds: . albuterol  2.5 mg Nebulization Q8H  . atorvastatin  10 mg Oral Daily  . Chlorhexidine Gluconate Cloth  6 each Topical Q0600  . donepezil  10 mg Oral QHS  . enoxaparin (LOVENOX) injection  40 mg Subcutaneous Q24H  . ferrous sulfate  325 mg Oral TID PC  . LORazepam  0.5 mg Oral BID  . mometasone-formoterol  2 puff Inhalation BID  . mupirocin ointment   Nasal BID  . polyethylene glycol  17 g Oral BID  . senna  2 tablet Oral QHS   Continuous Infusions:   PRN Meds:.albuterol, menthol-cetylpyridinium, metoCLOPramide (REGLAN) injection, metoCLOPramide, morphine injection, morphine injection, ondansetron (ZOFRAN) IV, ondansetron, phenol, promethazine,  traMADol  Antibiotics: Anti-infectives   Start     Dose/Rate Route Frequency Ordered Stop   03/22/13 0600  vancomycin (VANCOCIN) IVPB 1000 mg/200 mL premix     1,000 mg 200 mL/hr over 60 Minutes Intravenous Every 12 hours 03/21/13 1956 03/22/13 0621   03/21/13 2200  vancomycin (VANCOCIN) 1,000 mg in sodium chloride 0.9 % 250 mL IVPB  Status:  Discontinued     1,000 mg 250 mL/hr over 60 Minutes Intravenous Every 12 hours 03/21/13 1758 03/21/13 1956   03/21/13 1630  ceFAZolin (ANCEF) IVPB 2 g/50 mL premix     2 g 100 mL/hr over 30 Minutes Intravenous  Once 03/21/13 1623 03/21/13 1605       PHYSICAL EXAM: Vital signs in last 24 hours: Filed Vitals:   03/23/13 2200 03/23/13 2300 03/24/13 0000 03/24/13 0545  BP: 135/85 122/60 100/51 133/90  Pulse: 104 100 95 85  Temp: 98.5 F (36.9 C) 98.1 F (36.7 C) 97.8 F (36.6 C) 98.1 F (36.7 C)  TempSrc: Oral Oral Oral Oral  Resp: 18 18 18 18   Weight:      SpO2:    98%    Weight change:  Filed Weights   03/21/13 0400  Weight: 76.5 kg (168 lb 10.4 oz)   Body mass index is 30.34 kg/(m^2).   Gen Exam: Awake and somewhat alert, follows commands Neck: Supple, No JVD.   Chest: B/L Clear today CVS: S1 S2 Regular, no murmurs.  Abdomen: soft, BS +, non tender, non distended.  Extremities: no edema, lower extremities warm to touch. Neurologic: Non Focal.   Skin: No Rash.   Wounds: N/A.    Intake/Output from previous day:  Intake/Output Summary (Last 24 hours) at 03/24/13 1028 Last data filed at 03/24/13 1000  Gross per 24 hour  Intake   1210 ml  Output   2120 ml  Net   -910 ml     LAB RESULTS: CBC  Recent Labs Lab 03/20/13 2046 03/21/13 2030 03/22/13 0500 03/23/13 0905 03/24/13 0923  WBC 14.7* 14.3* 10.2 10.3 9.7  HGB 11.7* 9.5* 8.8* 6.8* 9.0*  HCT 35.0* 29.0* 27.2* 20.4* 26.2*  PLT 207 188 147* 147* 188  MCV 92.8 95.1 94.8 94.0 90.3  MCH 31.0 31.1 30.7 31.3 31.0  MCHC 33.4 32.8 32.4 33.3 34.4  RDW 13.2 13.6  13.7 13.5 14.0  LYMPHSABS 1.3  --   --   --   --   MONOABS 0.9  --   --   --   --   EOSABS 0.0  --   --   --   --   BASOSABS 0.0  --   --   --   --     Chemistries   Recent Labs Lab 03/20/13 2046 03/21/13 2030 03/22/13 0500 03/23/13 0905 03/24/13 0923  NA 135  --  139 130* 134*  K 3.9  --  4.7 4.1 3.8  CL 97  --  105 98 100  CO2 29  --  24 26 28   GLUCOSE 137*  --  113* 101* 99  BUN 16  --  25* 33* PENDING  CREATININE 0.63 0.84 1.17* 1.54* PENDING  CALCIUM 8.6  --  7.6* 6.9* 7.2*    CBG:  Recent Labs Lab 03/23/13 1150  GLUCAP 108*    GFR The CrCl is unknown because both a height and weight (above a minimum accepted value) are required for this calculation.  Coagulation profile  Recent Labs Lab 03/20/13 2046  INR 1.19    Cardiac Enzymes No results found for this basename: CK, CKMB, TROPONINI, MYOGLOBIN,  in the last 168 hours  No components found with this basename: POCBNP,  No results found for this basename: DDIMER,  in the last 72 hours No results found for this basename: HGBA1C,  in the last 72 hours No results found for this basename: CHOL, HDL, LDLCALC, TRIG, CHOLHDL, LDLDIRECT,  in the last 72 hours No results found for this basename: TSH, T4TOTAL, FREET3, T3FREE, THYROIDAB,  in the last 72 hours No results found for this basename: VITAMINB12, FOLATE, FERRITIN, TIBC, IRON, RETICCTPCT,  in the last 72 hours No results found for this basename: LIPASE, AMYLASE,  in the last 72 hours  Urine Studies No results found for this basename: UACOL, UAPR, USPG, UPH, UTP, UGL, UKET, UBIL, UHGB, UNIT, UROB, ULEU, UEPI, UWBC, URBC, UBAC, CAST, CRYS, UCOM, BILUA,  in the last 72 hours  MICROBIOLOGY: Recent Results (from the past 240 hour(s))  SURGICAL PCR SCREEN     Status: Abnormal   Collection Time    03/21/13  3:10 PM      Result Value Range Status   MRSA, PCR POSITIVE (*) NEGATIVE Final   Staphylococcus aureus POSITIVE (*) NEGATIVE Final   Comment:             The Xpert SA Assay (FDA     approved for NASAL specimens     in patients over 57 years of age),     is one component of     a comprehensive surveillance     program.  Test performance has     been validated by [employer] [facility] for patients greater     than or equal to 34 year old.     It is not intended     to diagnose infection nor to     guide or monitor treatment.    RADIOLOGY STUDIES/RESULTS: Dg Chest 1 View  03/20/2013   CLINICAL DATA:  Pain post trauma  EXAM: CHEST - 1 VIEW  COMPARISON:  March 04, 2010  FINDINGS: There is no edema or consolidation. Heart is upper normal in size with normal pulmonary vascularity. Aorta is prominent with atherosclerotic change, stable. No adenopathy. No pneumothorax. No bone lesions. There is upper thoracic levoscoliosis and thoracolumbar dextroscoliosis.  IMPRESSION: No edema or consolidation. No pneumothorax.   Electronically Signed   By: Bretta Bang   On: 03/20/2013 20:51   Dg Pelvis 1-2 Views  03/20/2013   CLINICAL DATA:  Pain post trauma  EXAM: PELVIS - 1-2 VIEW  COMPARISON:  February 28, 2010  FINDINGS: There is a comminuted fracture of the proximal femur which arises in the subtrochanteric region. There is avulsion of the lesser trochanter with medial displacement of the lesser trochanter. No other fractures. No dislocation. There is mild symmetric narrowing of both hip joints. Bones are osteoporotic.  IMPRESSION: Left-sided subtrochanteric comminuted fracture. Avulsion of lesser trochanter.   Electronically Signed   By: Bretta Bang   On: 03/20/2013 20:55   Dg Femur Left  03/21/2013   CLINICAL DATA:  Fracture fixation.  EXAM: LEFT FEMUR - 2 VIEW; DG C-ARM 61-120 MIN  COMPARISON:  Plain films 03/20/2013.  FINDINGS: Six fluoroscopic intraoperative spot views of the left hip are provided. Images demonstrate placement of a dynamic hip screw and long IM nail with a proximal cerclage wire and 2 distal interlocking screws for fixation of a  subtrochanteric fracture. No acute abnormality is identified.  IMPRESSION: ORIF left femur fracture. No acute abnormality.   Electronically Signed   By: Drusilla Kanner M.D.   On: 03/21/2013 21:16   Dg Femur Left  03/20/2013   CLINICAL DATA:  Status post fall with left leg pain.  EXAM: LEFT FEMUR - 2 VIEW  COMPARISON:  Pelvic radiographs 02/28/2010.  FINDINGS: There is an oblique, mildly comminuted and mildly displaced subtrochanteric fracture of the left femur. This demonstrates medial displacement. On the lateral view, there is moderate apex anterior angulation. There is no dislocation. The distal femur is suboptimally evaluated in the frontal examination due to the proximal injury.  IMPRESSION: Angulated and mildly displaced subtrochanteric fracture of the left femur.   Electronically Signed   By: Roxy Horseman   On: 03/20/2013 20:57   Ct Head Wo Contrast  03/20/2013   CLINICAL DATA:  Extreme leg and left hip pain status post fall.  EXAM: CT HEAD WITHOUT CONTRAST  CT CERVICAL SPINE WITHOUT CONTRAST  TECHNIQUE: Multidetector CT imaging of the head and cervical spine was performed following the standard protocol without intravenous contrast. Multiplanar CT image reconstructions of the cervical spine were also generated.  COMPARISON:  Head CT 02/28/2010. Cervical spine CT 01/23/2010.  FINDINGS: CT HEAD FINDINGS  There is chronic atrophy and periventricular white matter disease. A small lacunar infarct in the right lentiform nucleus has developed in the interval. Chronic small vessel ischemic changes in the left thalamus are stable. There is no evidence of acute intracranial hemorrhage, mass lesion, brain edema or extra-axial fluid collection.  The visualized paranasal sinuses, mastoid air cells and middle ears are clear. The calvarium is intact.  CT CERVICAL SPINE FINDINGS  There is no evidence of acute cervical spine fracture, traumatic subluxation or focal paraspinal abnormality. Multilevel spondylosis is  again noted with progressive generalized kyphosis. The C4-5 facet joints appear ankylosed. The lung apices are clear.  IMPRESSION: CT HEAD IMPRESSION  No acute intracranial or calvarial findings. Progressive chronic small vessel ischemic changes as described.  CT CERVICAL SPINE IMPRESSION  No evidence of acute cervical spine fracture, traumatic subluxation or static signs of instability. There is multilevel spondylosis with progressive reversal of the usual cervical lordosis.   Electronically Signed   By: Roxy Horseman   On: 03/20/2013 20:07   Ct Cervical Spine Wo Contrast  03/20/2013   CLINICAL DATA:  Extreme leg and left hip pain status post fall.  EXAM: CT HEAD WITHOUT CONTRAST  CT CERVICAL SPINE WITHOUT CONTRAST  TECHNIQUE: Multidetector CT imaging of the head and cervical spine was performed following the standard protocol without intravenous contrast. Multiplanar CT image reconstructions of the cervical spine were also generated.  COMPARISON:  Head CT 02/28/2010. Cervical spine CT 01/23/2010.  FINDINGS: CT HEAD FINDINGS  There is chronic atrophy and periventricular white matter disease. A small lacunar infarct in the right lentiform nucleus has developed in the interval. Chronic small vessel ischemic changes in the left thalamus are stable. There is no evidence of acute intracranial hemorrhage, mass lesion, brain edema or extra-axial fluid collection.  The visualized paranasal sinuses, mastoid air cells and middle ears are clear. The calvarium is intact.  CT CERVICAL SPINE FINDINGS  There is no evidence of acute cervical spine fracture, traumatic subluxation or focal paraspinal abnormality. Multilevel spondylosis is again noted with progressive generalized kyphosis. The C4-5 facet joints appear ankylosed. The lung apices are clear.  IMPRESSION: CT HEAD IMPRESSION  No acute intracranial or calvarial findings. Progressive chronic small vessel ischemic changes as described.  CT CERVICAL SPINE IMPRESSION  No  evidence of acute cervical spine fracture, traumatic subluxation or static signs of instability. There is multilevel spondylosis with progressive reversal of the usual cervical lordosis.   Electronically Signed   By: Roxy Horseman   On: 03/20/2013 20:07   Dg Pelvis Portable  03/21/2013   CLINICAL DATA:  Postop left femoral IM nail fixation.  EXAM: PORTABLE PELVIS  COMPARISON:  Intraoperative radiographs same date.  FINDINGS: Patient is status post left femoral dynamic hip screw and intramedullary nail fixation of the subtrochanteric femur fracture. There is a proximal cerclage wire. The fracture demonstrates near anatomic reduction. There is no evidence of complication or new fracture.  IMPRESSION: Near anatomic reduction of the subtrochanteric left femur fracture status post fixation.   Electronically Signed   By: Roxy Horseman   On: 03/21/2013 22:26   Dg Chest Port 1 View  03/23/2013   *RADIOLOGY REPORT*  Clinical Data: Shortness of breath, subsequent encounter.  PORTABLE CHEST - 1 VIEW  Comparison: 03/20/2013; 03/04/2010  Findings: Grossly unchanged enlarged cardiac silhouette and mediastinal contours.  Worsening left basilar/retrocardiac heterogeneous / consolidative opacities.  Mild pulmonary congestion without frank evidence of edema.  Trace left-sided pleural effusion is not excluded.  No pneumothorax.  Grossly unchanged bones.  IMPRESSION: 1.  Worsening left basilar opacities, possibly atelectasis though developing infection is not excluded. Further evaluation with a PA and lateral chest radiograph may be obtained as clinically indicated. 2.  Cardiomegaly and pulmonary congestion without frank evidence of edema.   Original Report Authenticated By: Tacey Ruiz, MD   Dg C-arm [phone] Min  03/21/2013   CLINICAL DATA:  Fracture fixation.  EXAM: LEFT FEMUR - 2 VIEW; DG C-ARM 61-120 MIN  COMPARISON:  Plain films 03/20/2013.  FINDINGS: Six fluoroscopic intraoperative spot views of the left hip are provided.  Images demonstrate placement of a dynamic hip screw and long IM nail with a proximal cerclage wire and 2 distal interlocking screws for fixation of a subtrochanteric fracture. No acute abnormality is identified.  IMPRESSION: ORIF left femur fracture. No acute abnormality.   Electronically Signed   By: Drusilla Kanner M.D.   On: 03/21/2013 21:16    Jeoffrey Massed, MD  [facility] Pager:[phone]  If 7PM-7AM, please contact night-coverage [email or web address] Password [email or web address] 03/24/2013, 10:28 AM   LOS: 4 days

## 2013-03-24 NOTE — Progress Notes (Signed)
   Subjective: 3 Days Post-Op Procedure(s) (LRB): INTRAMEDULLARY (IM) NAIL FEMORAL (Left)  Pt alert but still disoriented Denies pain today to left hip Patient reports pain as mild.  Objective:   VITALS:   Filed Vitals:   03/24/13 1110  BP: 136/52  Pulse: 99  Temp: 98.9 F (37.2 C)  Resp: 18    Left hip and thigh incisions healing well nv intact distally Mild edema distally  LABS  Recent Labs  03/22/13 0500 03/23/13 0905 03/24/13 0923  HGB 8.8* 6.8* 9.0*  HCT 27.2* 20.4* 26.2*  WBC 10.2 10.3 9.7  PLT 147* 147* 188     Recent Labs  03/22/13 0500 03/23/13 0905 03/24/13 0923  NA 139 130* 134*  K 4.7 4.1 3.8  BUN 25* 33* 19  CREATININE 1.17* 1.54* 0.73  GLUCOSE 113* 101* 99     Assessment/Plan: 3 Days Post-Op Procedure(s) (LRB): INTRAMEDULLARY (IM) NAIL FEMORAL (Left)  Left hip doing well Agree with snf placement Pt improved after transfusion from acute blood loss anemia Strict non weight bearing left lower extremity   Alphonsa Overall, MPAS, PA-C  03/24/2013, 5:53 PM

## 2013-03-24 NOTE — Progress Notes (Signed)
After not being able to urinate, we went to pt's bedside to straight cath and she had voided a large amount of incontinent urine.  Will continue to monitor.

## 2013-03-24 NOTE — Progress Notes (Signed)
NUTRITION FOLLOW UP  Intervention:   Ensure Complete po BID, each supplement provides 350 kcal and 13 grams of protein.  Nutrition Dx:   Increased nutrient needs related to recent fracture as evidenced by estimated needs; ongoing.    Goal:  Pt to meet >/= 90% of their estimated nutrition needs, not met.    Monitor:  PO intake, weight trend  Assessment:   Pt admitted from facility with hip fx. Pt has dementia and unable to answer questions. Per RN and student RN pt has refused to eat today.   Height: Ht Readings from Last 1 Encounters:  01/02/10 5' 2.5" (1.588 m)    Weight Status:   Wt Readings from Last 1 Encounters:  03/21/13 168 lb 10.4 oz (76.5 kg)    Re-estimated needs:  Kcal: 1500-1700  Protein: 70-80 grams  Fluid: >1.5 L/day  Skin: left hip incision  Diet Order: Full Liquid Meal Completion: <10%   Intake/Output Summary (Last 24 hours) at 03/24/13 1540 Last data filed at 03/24/13 1430  Gross per 24 hour  Intake   1120 ml  Output   2070 ml  Net   -950 ml    Last BM: 9/21   Labs:   Recent Labs Lab 03/22/13 0500 03/23/13 0905 03/24/13 0923  NA 139 130* 134*  K 4.7 4.1 3.8  CL 105 98 100  CO2 24 26 28   BUN 25* 33* 19  CREATININE 1.17* 1.54* 0.73  CALCIUM 7.6* 6.9* 7.2*  GLUCOSE 113* 101* 99    CBG (last 3)   Recent Labs  03/23/13 1150  GLUCAP 108*    Scheduled Meds: . albuterol  2.5 mg Nebulization Q8H  . atorvastatin  10 mg Oral Daily  . Chlorhexidine Gluconate Cloth  6 each Topical Q0600  . donepezil  10 mg Oral QHS  . enoxaparin (LOVENOX) injection  40 mg Subcutaneous Q24H  . ferrous sulfate  325 mg Oral TID PC  . LORazepam  0.5 mg Oral BID  . mometasone-formoterol  2 puff Inhalation BID  . mupirocin ointment   Nasal BID  . polyethylene glycol  17 g Oral BID  . senna  2 tablet Oral QHS    Continuous Infusions:   Kendell Bane RD, LDN, CNSC [phone] Pager [phone] After Hours Pager

## 2013-03-25 LAB — HEMOGLOBIN AND HEMATOCRIT, BLOOD: HCT: 25.5 % — ABNORMAL LOW (ref 36.0–46.0)

## 2013-03-25 MED ORDER — ENSURE COMPLETE PO LIQD: 237.0000 mL | Freq: Two times a day (BID) | ORAL | Status: DC

## 2013-03-25 MED ORDER — LORAZEPAM 0.5 MG PO TABS: 0.5000 mg | ORAL_TABLET | Freq: Two times a day (BID) | ORAL | Status: DC

## 2013-03-25 MED ORDER — POLYETHYLENE GLYCOL 3350 17 G PO PACK: 17.0000 g | PACK | Freq: Two times a day (BID) | ORAL | Status: DC

## 2013-03-25 MED ORDER — ACETAMINOPHEN 500 MG PO TABS: 1000.0000 mg | ORAL_TABLET | Freq: Two times a day (BID) | ORAL | Status: AC

## 2013-03-25 MED ORDER — FERROUS SULFATE 325 (65 FE) MG PO TABS: 325.0000 mg | ORAL_TABLET | Freq: Three times a day (TID) | ORAL | Status: DC

## 2013-03-25 MED ORDER — ALBUTEROL SULFATE (5 MG/ML) 0.5% IN NEBU: 2.5000 mg | INHALATION_SOLUTION | RESPIRATORY_TRACT | Status: DC | PRN

## 2013-03-25 MED ORDER — TRAMADOL HCL 50 MG PO TABS: 50.0000 mg | ORAL_TABLET | Freq: Four times a day (QID) | ORAL | Status: DC | PRN

## 2013-03-25 MED ORDER — ENOXAPARIN SODIUM 40 MG/0.4ML ~~LOC~~ SOLN: 40.0000 mg | Freq: Every day | SUBCUTANEOUS | Status: DC

## 2013-03-25 MED ORDER — ALBUTEROL SULFATE (5 MG/ML) 0.5% IN NEBU: 2.5000 mg | INHALATION_SOLUTION | Freq: Three times a day (TID) | RESPIRATORY_TRACT | Status: DC

## 2013-03-25 MED ORDER — ACETAMINOPHEN 325 MG PO TABS: 650.0000 mg | ORAL_TABLET | Freq: Three times a day (TID) | ORAL | Status: DC | PRN

## 2013-03-25 NOTE — Progress Notes (Signed)
Physical Therapy Treatment Patient Details Name: Lisa Watts MRN: [id number] DOB: 1924-11-05 Today's Date: 03/25/2013 Time: 8295-6213 PT Time Calculation (min): 30 min  PT Assessment / Plan / Recommendation  History of Present Illness Lisa Watts is a 79 y.o. female who presents to the ED after a fall.  Patient complaining of L hip and back pain since the fall.  She was walking and tripped over some objects on the floor (mechanical fall), no head injury, no LOC.  Patient does have fairly severe dementia with very limited short term memory (doesnt remember being told about the hip fracture a couple of hours ago in the ED and when I explained that she had a hip fracture she states "oh, is that what happened?").; Now s/p IM Nail   PT Comments   Patient with improved tolerance to mobility during session today.  Seemed to be more aware of her deficits as well.  Continue to recommend SNF level rehab at d/c.  Follow Up Recommendations  SNF     Does the patient have the potential to tolerate intense rehabilitation   N/A  Barriers to Discharge  None      Equipment Recommendations  Rolling walker with 5" wheels;3in1 (PT);Wheelchair (measurements PT);Wheelchair cushion (measurements PT)    Recommendations for Other Services  None  Frequency Min 3X/week   Progress towards PT Goals Progress towards PT goals: Progressing toward goals  Plan Current plan remains appropriate    Precautions / Restrictions Precautions Precautions: Fall Restrictions LLE Weight Bearing: Non weight bearing   Pertinent Vitals/Pain C/o pain with movement left LE unrated    Mobility  Bed Mobility Supine to Sit: 1: +2 Total assist;With rails Supine to Sit: Patient Percentage: 30% Sitting - Scoot to Edge of Bed: 2: Max assist;With rail Sit to Supine: 1: +2 Total assist;HOB flat Sit to Supine: Patient Percentage: 10% Details for Bed Mobility Assistance: Cues to scoot to edge with right foot on bed and assist,  needed assist to lift trunk and cues to increase participation. Transfers Transfers: Lateral/Scoot Transfers Lateral/Scoot Transfers: 1: +2 Total assist Lateral Transfers: Patient Percentage: 30% Details for Transfer Assistance: scooted towards head of bed prior to returning to supine with pad under patient and cues for anterior weight shift and keeping left LE NWB; OOB deferred due to planned d/c to SNF today)    Exercises General Exercises - Lower Extremity Ankle Circles/Pumps: AAROM;Both;10 reps;Supine Short Arc Quad: AAROM;Left;10 reps Heel Slides: AAROM;Both;10 reps;Supine     PT Goals (current goals can now be found in the care plan section)    Visit Information  Last PT Received On: 03/25/13 Assistance Needed: +2 History of Present Illness: Lisa Watts is a 79 y.o. female who presents to the ED after a fall.  Patient complaining of L hip and back pain since the fall.  She was walking and tripped over some objects on the floor (mechanical fall), no head injury, no LOC.  Patient does have fairly severe dementia with very limited short term memory (doesnt remember being told about the hip fracture a couple of hours ago in the ED and when I explained that she had a hip fracture she states "oh, is that what happened?").; Now s/p IM Nail    Subjective Data   Everyone has been so nice to me.   Cognition  Cognition Arousal/Alertness: Awake/alert Behavior During Therapy: Anxious Overall Cognitive Status: No family/caregiver present to determine baseline cognitive functioning Memory: Decreased short-term memory    Balance  Static Sitting Balance Static Sitting - Balance Support: Right upper extremity supported;Left upper extremity supported;Feet supported Static Sitting - Level of Assistance: 3: Mod assist;4: Min assist;5: Stand by assistance Static Sitting - Comment/# of Minutes: cues for left lateral lean due to leaning right away from sore hip.  Initially assisted for balance,  then able to sit upright unaided.  End of Session PT - End of Session Activity Tolerance: Patient tolerated treatment well Patient left: in bed;with call bell/phone within reach;with bed alarm set   GP     [facility] 03/25/2013, 1:43 PM [place], [place] [phone] 03/25/2013

## 2013-03-25 NOTE — Progress Notes (Signed)
Patient discharged to [facility]. Report called to RN at [facility]. Patient left unit in a stable condition via EMS.

## 2013-03-25 NOTE — Discharge Summary (Signed)
PATIENT DETAILS Name: Lisa Watts Age: 79 y.o. Sex: female Date of Birth: Jan 19, 1925 MRN: [id number]. Admit Date: 03/20/2013 Admitting Physician: Hillary Bow, DO PCP:No PCP Per Patient  Recommendations for Outpatient Follow-up:  1. Monitor CBC closely, required 2 units of PRBC transfusion 2. Monitor BMET periodically  PRIMARY DISCHARGE DIAGNOSIS:  Principal Problem:   Subtrochanteric fracture of left femur   Acute Blood Loss Anemia     PAST MEDICAL HISTORY: Past Medical History  Diagnosis Date  . Hypertension   . Dementia   . Osteoporosis with fracture   . GI bleed 2011    DISCHARGE MEDICATIONS:   Medication List    STOP taking these medications       losartan 50 MG tablet  Commonly known as:  COZAAR      TAKE these medications       acetaminophen 500 MG tablet  Commonly known as:  TYLENOL  Take 2 tablets (1,000 mg total) by mouth 2 (two) times daily.     acetaminophen 325 MG tablet  Commonly known as:  TYLENOL  Take 2 tablets (650 mg total) by mouth every 8 (eight) hours as needed for pain.     albuterol (5 MG/ML) 0.5% nebulizer solution  Commonly known as:  PROVENTIL  Take 0.5 mLs (2.5 mg total) by nebulization every 8 (eight) hours.     albuterol (5 MG/ML) 0.5% nebulizer solution  Commonly known as:  PROVENTIL  Take 0.5 mLs (2.5 mg total) by nebulization every 2 (two) hours as needed for wheezing or shortness of breath.     atorvastatin 10 MG tablet  Commonly known as:  LIPITOR  Take 10 mg by mouth daily.     donepezil 10 MG tablet  Commonly known as:  ARICEPT  Take 10 mg by mouth at bedtime.     enoxaparin 40 MG/0.4ML injection  Commonly known as:  LOVENOX  Inject 0.4 mLs (40 mg total) into the skin daily. Continue for 3 weeks from 03/22/13     feeding supplement Liqd  Take 237 mLs by mouth 2 (two) times daily between meals.     ferrous sulfate 325 (65 FE) MG tablet  Take 1 tablet (325 mg total) by mouth 3 (three) times daily after  meals.     Fluticasone-Salmeterol 250-50 MCG/DOSE Aepb  Commonly known as:  ADVAIR  Inhale 1 puff into the lungs every 12 (twelve) hours.     ipratropium-albuterol 0.5-2.5 (3) MG/3ML Soln  Commonly known as:  DUONEB  Take 3 mLs by nebulization every 6 (six) hours as needed.     LORazepam 0.5 MG tablet  Commonly known as:  ATIVAN  Take 1 tablet (0.5 mg total) by mouth 2 (two) times daily.     multivitamin with minerals Tabs tablet  Take 1 tablet by mouth daily.     polyethylene glycol packet  Commonly known as:  MIRALAX / GLYCOLAX  Take 17 g by mouth 2 (two) times daily.     traMADol 50 MG tablet  Commonly known as:  ULTRAM  Take 1 tablet (50 mg total) by mouth every 6 (six) hours as needed.        ALLERGIES:  No Known Allergies  BRIEF HPI:  See H&P, Labs, Consult and Test reports for all details in brief,Lisa Watts is a 79 y.o. female who presents to the ED after a fall. She was found to have Subtrochanteric fracture of left femur   CONSULTATIONS:   orthopedic surgery  PERTINENT RADIOLOGIC  STUDIES: Dg Chest 1 View  03/20/2013   CLINICAL DATA:  Pain post trauma  EXAM: CHEST - 1 VIEW  COMPARISON:  March 04, 2010  FINDINGS: There is no edema or consolidation. Heart is upper normal in size with normal pulmonary vascularity. Aorta is prominent with atherosclerotic change, stable. No adenopathy. No pneumothorax. No bone lesions. There is upper thoracic levoscoliosis and thoracolumbar dextroscoliosis.  IMPRESSION: No edema or consolidation. No pneumothorax.   Electronically Signed   By: Bretta Bang   On: 03/20/2013 20:51   Dg Pelvis 1-2 Views  03/20/2013   CLINICAL DATA:  Pain post trauma  EXAM: PELVIS - 1-2 VIEW  COMPARISON:  February 28, 2010  FINDINGS: There is a comminuted fracture of the proximal femur which arises in the subtrochanteric region. There is avulsion of the lesser trochanter with medial displacement of the lesser trochanter. No other fractures. No  dislocation. There is mild symmetric narrowing of both hip joints. Bones are osteoporotic.  IMPRESSION: Left-sided subtrochanteric comminuted fracture. Avulsion of lesser trochanter.   Electronically Signed   By: Bretta Bang   On: 03/20/2013 20:55   Dg Femur Left  03/21/2013   CLINICAL DATA:  Fracture fixation.  EXAM: LEFT FEMUR - 2 VIEW; DG C-ARM 61-120 MIN  COMPARISON:  Plain films 03/20/2013.  FINDINGS: Six fluoroscopic intraoperative spot views of the left hip are provided. Images demonstrate placement of a dynamic hip screw and long IM nail with a proximal cerclage wire and 2 distal interlocking screws for fixation of a subtrochanteric fracture. No acute abnormality is identified.  IMPRESSION: ORIF left femur fracture. No acute abnormality.   Electronically Signed   By: Drusilla Kanner M.D.   On: 03/21/2013 21:16   Dg Femur Left  03/20/2013   CLINICAL DATA:  Status post fall with left leg pain.  EXAM: LEFT FEMUR - 2 VIEW  COMPARISON:  Pelvic radiographs 02/28/2010.  FINDINGS: There is an oblique, mildly comminuted and mildly displaced subtrochanteric fracture of the left femur. This demonstrates medial displacement. On the lateral view, there is moderate apex anterior angulation. There is no dislocation. The distal femur is suboptimally evaluated in the frontal examination due to the proximal injury.  IMPRESSION: Angulated and mildly displaced subtrochanteric fracture of the left femur.   Electronically Signed   By: Roxy Horseman   On: 03/20/2013 20:57   Ct Head Wo Contrast  03/20/2013   CLINICAL DATA:  Extreme leg and left hip pain status post fall.  EXAM: CT HEAD WITHOUT CONTRAST  CT CERVICAL SPINE WITHOUT CONTRAST  TECHNIQUE: Multidetector CT imaging of the head and cervical spine was performed following the standard protocol without intravenous contrast. Multiplanar CT image reconstructions of the cervical spine were also generated.  COMPARISON:  Head CT 02/28/2010. Cervical spine CT  01/23/2010.  FINDINGS: CT HEAD FINDINGS  There is chronic atrophy and periventricular white matter disease. A small lacunar infarct in the right lentiform nucleus has developed in the interval. Chronic small vessel ischemic changes in the left thalamus are stable. There is no evidence of acute intracranial hemorrhage, mass lesion, brain edema or extra-axial fluid collection.  The visualized paranasal sinuses, mastoid air cells and middle ears are clear. The calvarium is intact.  CT CERVICAL SPINE FINDINGS  There is no evidence of acute cervical spine fracture, traumatic subluxation or focal paraspinal abnormality. Multilevel spondylosis is again noted with progressive generalized kyphosis. The C4-5 facet joints appear ankylosed. The lung apices are clear.  IMPRESSION: CT HEAD IMPRESSION  No  acute intracranial or calvarial findings. Progressive chronic small vessel ischemic changes as described.  CT CERVICAL SPINE IMPRESSION  No evidence of acute cervical spine fracture, traumatic subluxation or static signs of instability. There is multilevel spondylosis with progressive reversal of the usual cervical lordosis.   Electronically Signed   By: Roxy Horseman   On: 03/20/2013 20:07   Ct Cervical Spine Wo Contrast  03/20/2013   CLINICAL DATA:  Extreme leg and left hip pain status post fall.  EXAM: CT HEAD WITHOUT CONTRAST  CT CERVICAL SPINE WITHOUT CONTRAST  TECHNIQUE: Multidetector CT imaging of the head and cervical spine was performed following the standard protocol without intravenous contrast. Multiplanar CT image reconstructions of the cervical spine were also generated.  COMPARISON:  Head CT 02/28/2010. Cervical spine CT 01/23/2010.  FINDINGS: CT HEAD FINDINGS  There is chronic atrophy and periventricular white matter disease. A small lacunar infarct in the right lentiform nucleus has developed in the interval. Chronic small vessel ischemic changes in the left thalamus are stable. There is no evidence of acute  intracranial hemorrhage, mass lesion, brain edema or extra-axial fluid collection.  The visualized paranasal sinuses, mastoid air cells and middle ears are clear. The calvarium is intact.  CT CERVICAL SPINE FINDINGS  There is no evidence of acute cervical spine fracture, traumatic subluxation or focal paraspinal abnormality. Multilevel spondylosis is again noted with progressive generalized kyphosis. The C4-5 facet joints appear ankylosed. The lung apices are clear.  IMPRESSION: CT HEAD IMPRESSION  No acute intracranial or calvarial findings. Progressive chronic small vessel ischemic changes as described.  CT CERVICAL SPINE IMPRESSION  No evidence of acute cervical spine fracture, traumatic subluxation or static signs of instability. There is multilevel spondylosis with progressive reversal of the usual cervical lordosis.   Electronically Signed   By: Roxy Horseman   On: 03/20/2013 20:07   Dg Pelvis Portable  03/21/2013   CLINICAL DATA:  Postop left femoral IM nail fixation.  EXAM: PORTABLE PELVIS  COMPARISON:  Intraoperative radiographs same date.  FINDINGS: Patient is status post left femoral dynamic hip screw and intramedullary nail fixation of the subtrochanteric femur fracture. There is a proximal cerclage wire. The fracture demonstrates near anatomic reduction. There is no evidence of complication or new fracture.  IMPRESSION: Near anatomic reduction of the subtrochanteric left femur fracture status post fixation.   Electronically Signed   By: Roxy Horseman   On: 03/21/2013 22:26   Dg Chest Port 1 View  03/23/2013   *RADIOLOGY REPORT*  Clinical Data: Shortness of breath, subsequent encounter.  PORTABLE CHEST - 1 VIEW  Comparison: 03/20/2013; 03/04/2010  Findings: Grossly unchanged enlarged cardiac silhouette and mediastinal contours.  Worsening left basilar/retrocardiac heterogeneous / consolidative opacities.  Mild pulmonary congestion without frank evidence of edema.  Trace left-sided pleural effusion is  not excluded.  No pneumothorax.  Grossly unchanged bones.  IMPRESSION: 1.  Worsening left basilar opacities, possibly atelectasis though developing infection is not excluded. Further evaluation with a PA and lateral chest radiograph may be obtained as clinically indicated. 2.  Cardiomegaly and pulmonary congestion without frank evidence of edema.   Original Report Authenticated By: Tacey Ruiz, MD   Dg C-arm [phone] Min  03/21/2013   CLINICAL DATA:  Fracture fixation.  EXAM: LEFT FEMUR - 2 VIEW; DG C-ARM 61-120 MIN  COMPARISON:  Plain films 03/20/2013.  FINDINGS: Six fluoroscopic intraoperative spot views of the left hip are provided. Images demonstrate placement of a dynamic hip screw and long IM nail  with a proximal cerclage wire and 2 distal interlocking screws for fixation of a subtrochanteric fracture. No acute abnormality is identified.  IMPRESSION: ORIF left femur fracture. No acute abnormality.   Electronically Signed   By: Drusilla Kanner M.D.   On: 03/21/2013 21:16     PERTINENT LAB RESULTS: CBC:  Recent Labs  03/23/13 0905 03/24/13 0923 03/25/13 0030  WBC 10.3 9.7  --   HGB 6.8* 9.0* 8.7*  HCT 20.4* 26.2* 25.5*  PLT 147* 188  --    CMET CMP     Component Value Date/Time   NA 134* 03/24/2013 0923   K 3.8 03/24/2013 0923   CL 100 03/24/2013 0923   CO2 28 03/24/2013 0923   GLUCOSE 99 03/24/2013 0923   BUN 19 03/24/2013 0923   CREATININE 0.73 03/24/2013 0923   CALCIUM 7.2* 03/24/2013 0923   PROT 6.4 12/11/2009 1540   ALBUMIN 3.9 12/11/2009 1540   AST 28 12/11/2009 1540   ALT 19 12/11/2009 1540   ALKPHOS 21* 12/11/2009 1540   BILITOT 0.4 12/11/2009 1540   GFRNONAA 76* 03/24/2013 0923   GFRAA 88* 03/24/2013 0923    GFR The CrCl is unknown because both a height and weight (above a minimum accepted value) are required for this calculation. No results found for this basename: LIPASE, AMYLASE,  in the last 72 hours No results found for this basename: CKTOTAL, CKMB, CKMBINDEX,  TROPONINI,  in the last 72 hours No components found with this basename: POCBNP,  No results found for this basename: DDIMER,  in the last 72 hours No results found for this basename: HGBA1C,  in the last 72 hours No results found for this basename: CHOL, HDL, LDLCALC, TRIG, CHOLHDL, LDLDIRECT,  in the last 72 hours No results found for this basename: TSH, T4TOTAL, FREET3, T3FREE, THYROIDAB,  in the last 72 hours No results found for this basename: VITAMINB12, FOLATE, FERRITIN, TIBC, IRON, RETICCTPCT,  in the last 72 hours Coags: No results found for this basename: PT, INR,  in the last 72 hours Microbiology: Recent Results (from the past 240 hour(s))  SURGICAL PCR SCREEN     Status: Abnormal   Collection Time    03/21/13  3:10 PM      Result Value Range Status   MRSA, PCR POSITIVE (*) NEGATIVE Final   Staphylococcus aureus POSITIVE (*) NEGATIVE Final   Comment:            The Xpert SA Assay (FDA     approved for NASAL specimens     in patients over 42 years of age),     is one component of     a comprehensive surveillance     program.  Test performance has     been validated by [employer] for patients greater     than or equal to 65 year old.     It is not intended     to diagnose infection nor to     guide or monitor treatment.     BRIEF HOSPITAL COURSE:  Subtrochanteric fracture of left femur  -Post intramedullary nail fixation on 9/22  - Avoid narcotics as much as possible, place on scheduled Tylenol, if needed use tramadol, given hx of dementia -needs follow up with Ortho  Anemia  - Secondary to perioperative blood loss  - Transfused 2 units of PRBC on 9/24 -Hb stable at 8.7 on discharge, please monitor periodically while at SNF  Acute renal failure  -  likely pre-renal -Gently hydrated, resolved -has chronic incontinence  -please check lytes periodically  Dyslipidemia  - Continue with statin   Dementia with Delirium - Mild delirium-patient pleasantly  confused-suspect she is at her baseline - Continue with Aricept  - Continue lorazepam 0.5 mg twice a day   History of COPD  - Started scheduled albuterol, needs incentive spirometry flutter valve and mobilization   TODAY-DAY OF DISCHARGE:  Subjective:   Kaliyan Osbourn today has no headache,no chest abdominal pain,no new weakness tingling or numbness.  Objective:   Blood pressure 133/65, pulse 87, temperature 98.2 F (36.8 C), temperature source Oral, resp. rate 18, weight 76.5 kg (168 lb 10.4 oz), SpO2 100.00%.  Intake/Output Summary (Last 24 hours) at 03/25/13 1105 Last data filed at 03/24/13 2200  Gross per 24 hour  Intake    600 ml  Output    201 ml  Net    399 ml   Filed Weights   03/21/13 0400  Weight: 76.5 kg (168 lb 10.4 oz)    Exam Awake Alert, Oriented *3, No new F.N deficits, Normal affect [place].AT,PERRAL Supple Neck,No JVD, No cervical lymphadenopathy appriciated.  Symmetrical Chest wall movement, Good air movement bilaterally, CTAB RRR,No Gallops,Rubs or new Murmurs, No Parasternal Heave +ve B.Sounds, Abd Soft, Non tender, No organomegaly appriciated, No rebound -guarding or rigidity. No Cyanosis, Clubbing or edema, No new Rash or bruise  DISCHARGE CONDITION: Stable  DISPOSITION: SNF  DISCHARGE INSTRUCTIONS:    Activity:  As tolerated with Full fall precautions use walker/cane & assistance as needed  Diet recommendation: Heart Healthy diet       Discharge Orders   Future Orders Complete By Expires   Call MD for:  redness, tenderness, or signs of infection (pain, swelling, redness, odor or green/yellow discharge around incision site)  As directed    Diet - low sodium heart healthy  As directed    Increase activity slowly  As directed    Non weight bearing  As directed    Comments:     Left LE      Follow-up Information   Follow up with NORRIS,STEVEN R, MD In 2 weeks. [phone])    Specialty:  Orthopedic Surgery   Contact information:   [address] [place] [address] [phone]       Total Time spent on discharge equals 45 minutes.  SignedJeoffrey Massed 03/25/2013 11:05 AM

## 2013-03-25 NOTE — Clinical Social Work Placement (Signed)
Clinical Social Work Department  CLINICAL SOCIAL WORK PLACEMENT NOTE  Patient: Lisa Watts Number: [id number] Admit date: 03/20/13  Clinical Social Worker: Sabino Niemann LCSWA Date/time: 03/24/13 3:30 PM  Clinical Social Work is seeking post-discharge placement for this patient at the following level of care: SKILLED NURSING (*CSW will update this form in Epic as items are completed)  03/24/13 Patient/family provided with [facility] Department of Clinical Social Work's list of facilities offering this level of care within the geographic area requested by the patient (or if unable, by the patient's family).  9/25/14Patient/family informed of their freedom to choose among providers that offer the needed level of care, that participate in Medicare, Medicaid or managed care program needed by the patient, have an available bed and are willing to accept the patient.  03/24/13 Patient/family informed of [facility]' ownership interest in [facility], as well as of the fact that they are under no obligation to receive care at this facility.  PASARR submitted to EDS on Pre-exisitng PASARR number received from EDS on FL2 transmitted to all facilities in geographic area requested by pt/family on 03/24/13  FL2 transmitted to all facilities within larger geographic area on  Patient informed that his/her managed care company has contracts with or will negotiate with certain facilities, including the following:  Patient/family informed of bed offers received: 03/24/13 - Patient's gaurdian Patient chooses bed at [facility] Physician recommends and patient chooses bed at  Patient to be transferred to on 03/25/2013  Patient to be transferred to facility by [facility]  The following physician request were entered in Epic:  Additional Comments:  Sabino Niemann, MSW, LCSWA  [phone]  Signing off

## 2013-03-25 NOTE — Progress Notes (Signed)
Orthopedics Progress Note  Subjective: I am tired. The pain is better.  Objective:  Filed Vitals:   03/25/13 0537  BP: 133/65  Pulse: 87  Temp: 98.2 F (36.8 C)  Resp: 18    General: Awake and alert  Musculoskeletal: Left hip wounds CDI, Mepilex dressing in place, no major swelling, no erythema Neurovascularly intact  Lab Results  Component Value Date   WBC 9.7 03/24/2013   HGB 8.7* 03/25/2013   HCT 25.5* 03/25/2013   MCV 90.3 03/24/2013   PLT 188 03/24/2013       Component Value Date/Time   NA 134* 03/24/2013 0923   K 3.8 03/24/2013 0923   CL 100 03/24/2013 0923   CO2 28 03/24/2013 0923   GLUCOSE 99 03/24/2013 0923   BUN 19 03/24/2013 0923   CREATININE 0.73 03/24/2013 0923   CALCIUM 7.2* 03/24/2013 0923   GFRNONAA 76* 03/24/2013 0923   GFRAA 88* 03/24/2013 0923    Lab Results  Component Value Date   INR 1.19 03/20/2013    Assessment/Plan: POD #3 s/p Procedure(s): INTRAMEDULLARY (IM) NAIL FEMORAL Stable from ortho standpoint. Remains anemic - acute blood loss anemia.  I would continue to monitor that in Rehab. Continue Lovenox and mechanical DVT prophylaxis Fresh daily dry dressings  Follow up in two with me in the office. [phone]  Almedia Balls Ranell Patrick, MD 03/25/2013 10:18 AM

## 2015-07-11 ENCOUNTER — Encounter: Payer: Self-pay | Admitting: *Deleted

## 2015-07-12 ENCOUNTER — Ambulatory Visit (INDEPENDENT_AMBULATORY_CARE_PROVIDER_SITE_OTHER): Payer: Medicaid Other | Admitting: Cardiology

## 2015-07-12 ENCOUNTER — Encounter: Payer: Self-pay | Admitting: Cardiology

## 2015-07-12 VITALS — BP 119/76 | HR 64 | Ht 64.0 in | Wt 191.8 lb

## 2015-07-12 DIAGNOSIS — I10 Essential (primary) hypertension: Secondary | ICD-10-CM | POA: Diagnosis not present

## 2015-07-12 DIAGNOSIS — I872 Venous insufficiency (chronic) (peripheral): Secondary | ICD-10-CM | POA: Diagnosis not present

## 2015-07-12 DIAGNOSIS — J449 Chronic obstructive pulmonary disease, unspecified: Secondary | ICD-10-CM

## 2015-07-12 DIAGNOSIS — I5032 Chronic diastolic (congestive) heart failure: Secondary | ICD-10-CM | POA: Diagnosis not present

## 2015-07-12 DIAGNOSIS — Z136 Encounter for screening for cardiovascular disorders: Secondary | ICD-10-CM

## 2015-07-12 NOTE — Patient Instructions (Signed)
Your physician recommends that you continue on your current medications as directed. Please refer to the Current Medication list given to you today.  Your physician recommends that you schedule a follow-up appointment in: as needed  

## 2015-07-12 NOTE — Progress Notes (Signed)
Cardiology Office Note  Date: 07/12/2015   ID: Lisa Watts, DOB 08-09-1924, MRN [id number]  PCP: Colon BranchQURESHI, AYYAZ, MD  Consulting Cardiologist: Nona DellSamuel McDowell, MD   Chief Complaint  Patient presents with  . Congestive Heart Failure    History of Present Illness: Lisa Watts is a 82 y.o. female referred for cardiology consultation by Dr. Colon BranchAyyaz Qureshi. Limited records are available. She is a resident of [facility], a ward of the state, is here with an attendant from the facility and also her guardian Child psychotherapistsocial worker. She is referred with a diagnosis of congestive heart failure. She is followed by a nurse practitioner on a regular basis at her facility. My understanding in getting history from the social worker is that Mrs. Konrad DoloresLester has had trouble with leg edema over the last 6 months. She had an echocardiogram done in October 2016 which is reviewed below showing LVEF 50-55% and mild diastolic dysfunction. She has been on Lasix and has had improvement in her leg edema, not complete resolution.  She is in a wheelchair most of the time and is able to stand and pivot but not ambulate to any significant degree. She has dementia and does not provide substantial history.  I reviewed her medications which include aspirin, Toprol-XL, and Lasix. ECG today shows sinus arrhythmia with nonspecific ST/T changes.  History includes COPD, severity is uncertain.  Past Medical History  Diagnosis Date  . Essential hypertension   . Osteoporosis with fracture   . History of GI bleed 2011  . Dementia   . COPD (chronic obstructive pulmonary disease) (HCC)   . Chronic pain   . GERD (gastroesophageal reflux disease)   . Anxiety and depression   . Osteoarthritis   . Left leg cellulitis     Past Surgical History  Procedure Laterality Date  . Cholecystectomy    . Femur im nail Left 03/21/2013    Procedure: INTRAMEDULLARY (IM) NAIL FEMORAL;  Surgeon: Verlee RossettiSteven R Norris, MD;  [facility] OR;   Service: Orthopedics;  Laterality: Left;    Current Outpatient Prescriptions  Medication Sig Dispense Refill  . acetaminophen (TYLENOL) 500 MG tablet Take 2 tablets (1,000 mg total) by mouth 2 (two) times daily.    Marland Kitchen. acetaminophen (TYLENOL) 650 MG CR tablet Take 650 mg by mouth as needed for pain.    Marland Kitchen. aspirin 81 MG tablet Take 81 mg by mouth daily.    . budesonide-formoterol (SYMBICORT) 160-4.5 MCG/ACT inhaler Inhale 2 puffs into the lungs 2 (two) times daily.    . Cholecalciferol (VITAMIN D3) 50000 units CAPS Take 1 capsule by mouth every 30 (thirty) days.    Marland Kitchen. donepezil (ARICEPT) 10 MG tablet Take 10 mg by mouth at bedtime.     . furosemide (LASIX) 40 MG tablet Take 40 mg by mouth daily.    Marland Kitchen. ipratropium-albuterol (DUONEB) 0.5-2.5 (3) MG/3ML SOLN Take 3 mLs by nebulization every 6 (six) hours as needed.    Marland Kitchen. LORazepam (ATIVAN) 0.5 MG tablet Take 0.25 mg by mouth 2 (two) times daily.    . metoprolol succinate (TOPROL-XL) 25 MG 24 hr tablet Take 12.5 mg by mouth daily.    . Multiple Vitamin (MULTIVITAMIN WITH MINERALS) TABS tablet Take 1 tablet by mouth daily.    . polyethylene glycol (MIRALAX / GLYCOLAX) packet Take 17 g by mouth 2 (two) times daily. 14 each 0  . tiotropium (SPIRIVA) 18 MCG inhalation capsule Place 18 mcg into inhaler and inhale daily.  No current facility-administered medications for this visit.   Allergies:  Review of patient's allergies indicates no known allergies.   Social History: The patient  reports that she has never smoked. She does not have any smokeless tobacco history on file. She reports that she does not drink alcohol or use illicit drugs.   Family History: Unobtainable in patient with dementia.  ROS:  Please see the history of present illness. Unable to conduct a complete review of systems due to the patient's dementia.   Physical Exam: VS:  BP 119/76 mmHg  Pulse 64  Ht 5\' 4"  (1.626 m)  Wt 191 lb 12.8 oz (87 kg)  BMI 32.91 kg/m2  SpO2 100%, BMI  Body mass index is 32.91 kg/(m^2).  Wt Readings from Last 3 Encounters:  07/12/15 191 lb 12.8 oz (87 kg)  03/21/13 168 lb 10.4 oz (76.5 kg)  01/02/10 121 lb (54.885 kg)    General: Elderly woman seated in a wheelchair. HEENT: Conjunctiva and lids normal, oropharynx clear. Neck: Supple, no elevated JVP or carotid bruits, no thyromegaly. Lungs: Clear to auscultation, nonlabored breathing at rest. Cardiac: Regular rate and rhythm, no S3, soft basal systolic murmur, no pericardial rub. Abdomen: Soft, nontender, bowel sounds present. Extremities: 1-2+ leg edema with associated venous stasis, distal pulses 2+. Skin: Warm and dry. Dressed area on the left lower leg, reported to have been an abrasion. Musculoskeletal: No kyphosis. Neuropsychiatric: Alert and oriented x1, affect grossly appropriate.  ECG: ECG is ordered today.  Other Studies Reviewed Today:  Echocardiogram 04/03/2015 (outside report): Normal LV wall thickness with LVEF 50-55%, grade 1 diastolic dysfunction, mild mitral and tricuspid regurgitation, PASP not evaluated.  Chest x-ray 03/03/2015 (outside report): No acute pulmonary process, cardiomegaly.  Chest CT 11/18/2013 [facility]): Atelectasis without infiltrate. Chronically ectatic thoracic aorta and coronary atherosclerosis, pectus excavatum with sternal tilt.   Assessment and Plan:  1. Bilateral leg edema, reported to be noticeable over the last 6 months, and now improving with Lasix. She had an echocardiogram in October 2016 that demonstrated LVEF of 50-55% with grade 1 diastolic dysfunction and no major valvular abnormalities. In the setting of hypertension, diastolic dysfunction could certainly be contributing, but she also looks to have a component of venous insufficiency. COPD could also potentially lead to peripheral edema if it effected RV function with elevated pulmonary pressures, although there was not necessarily description of this by her most recent  echocardiogram. At this point would agree with present medical regimen including Toprol-XL for heart rate and blood pressure control as well as Lasix. I would recommend regular weights and screening for need to either further up titrate Lasix or use higher doses as needed. She should also have interval BMET to ensure stability in renal function and electrolytes. No additional cardiac studies are being arranged at this time.  2. Essential hypertension, pressure control looks good today.  3. COPD by history but no reported tobacco use. Severity is uncertain.  Current medicines were reviewed with the patient today.   Orders Placed This Encounter  Procedures  . EKG 12-Lead    Disposition: Follow-up as needed.   Signed, Jonelle Sidle, MD, [facility] 07/12/2015 2:13 PM    [facility] at [facility] [address] [place], [place], [place] [address] Phone: [phone]; Fax: [phone]

## 2015-07-26 ENCOUNTER — Encounter (HOSPITAL_BASED_OUTPATIENT_CLINIC_OR_DEPARTMENT_OTHER): Payer: Medicare Other | Attending: Internal Medicine

## 2015-07-26 DIAGNOSIS — I739 Peripheral vascular disease, unspecified: Secondary | ICD-10-CM | POA: Diagnosis not present

## 2015-07-26 DIAGNOSIS — G309 Alzheimer's disease, unspecified: Secondary | ICD-10-CM | POA: Diagnosis not present

## 2015-07-26 DIAGNOSIS — I10 Essential (primary) hypertension: Secondary | ICD-10-CM | POA: Diagnosis not present

## 2015-07-26 DIAGNOSIS — L97821 Non-pressure chronic ulcer of other part of left lower leg limited to breakdown of skin: Secondary | ICD-10-CM | POA: Diagnosis not present

## 2015-07-26 DIAGNOSIS — F0280 Dementia in other diseases classified elsewhere without behavioral disturbance: Secondary | ICD-10-CM | POA: Insufficient documentation

## 2015-07-26 DIAGNOSIS — J449 Chronic obstructive pulmonary disease, unspecified: Secondary | ICD-10-CM | POA: Diagnosis not present

## 2015-07-26 DIAGNOSIS — I872 Venous insufficiency (chronic) (peripheral): Secondary | ICD-10-CM | POA: Diagnosis not present

## 2015-08-02 ENCOUNTER — Other Ambulatory Visit: Payer: Self-pay | Admitting: Internal Medicine

## 2015-08-02 ENCOUNTER — Ambulatory Visit (HOSPITAL_COMMUNITY)
Admission: RE | Admit: 2015-08-02 | Discharge: 2015-08-02 | Disposition: A | Discharge status: Home / Self Care | Payer: Medicare Other | Source: Ambulatory Visit | Attending: Vascular Surgery | Admitting: Vascular Surgery

## 2015-08-02 DIAGNOSIS — L97929 Non-pressure chronic ulcer of unspecified part of left lower leg with unspecified severity: Secondary | ICD-10-CM

## 2015-08-03 ENCOUNTER — Encounter (HOSPITAL_BASED_OUTPATIENT_CLINIC_OR_DEPARTMENT_OTHER): Payer: Medicare Other | Attending: Internal Medicine

## 2015-08-03 DIAGNOSIS — J449 Chronic obstructive pulmonary disease, unspecified: Secondary | ICD-10-CM | POA: Insufficient documentation

## 2015-08-03 DIAGNOSIS — I10 Essential (primary) hypertension: Secondary | ICD-10-CM | POA: Insufficient documentation

## 2015-08-03 DIAGNOSIS — I70248 Atherosclerosis of native arteries of left leg with ulceration of other part of lower left leg: Secondary | ICD-10-CM | POA: Insufficient documentation

## 2015-08-03 DIAGNOSIS — L97821 Non-pressure chronic ulcer of other part of left lower leg limited to breakdown of skin: Secondary | ICD-10-CM | POA: Diagnosis present

## 2015-08-03 DIAGNOSIS — F0390 Unspecified dementia without behavioral disturbance: Secondary | ICD-10-CM | POA: Insufficient documentation

## 2015-08-10 DIAGNOSIS — I70248 Atherosclerosis of native arteries of left leg with ulceration of other part of lower left leg: Secondary | ICD-10-CM | POA: Diagnosis not present

## 2015-08-24 DIAGNOSIS — I70248 Atherosclerosis of native arteries of left leg with ulceration of other part of lower left leg: Secondary | ICD-10-CM | POA: Diagnosis not present

## 2015-09-06 ENCOUNTER — Encounter (HOSPITAL_BASED_OUTPATIENT_CLINIC_OR_DEPARTMENT_OTHER): Payer: Medicare Other | Attending: Internal Medicine

## 2015-09-06 DIAGNOSIS — I10 Essential (primary) hypertension: Secondary | ICD-10-CM | POA: Insufficient documentation

## 2015-09-06 DIAGNOSIS — F0390 Unspecified dementia without behavioral disturbance: Secondary | ICD-10-CM | POA: Insufficient documentation

## 2015-09-06 DIAGNOSIS — L97221 Non-pressure chronic ulcer of left calf limited to breakdown of skin: Secondary | ICD-10-CM | POA: Diagnosis present

## 2015-09-06 DIAGNOSIS — J449 Chronic obstructive pulmonary disease, unspecified: Secondary | ICD-10-CM | POA: Diagnosis not present

## 2015-09-06 DIAGNOSIS — L97821 Non-pressure chronic ulcer of other part of left lower leg limited to breakdown of skin: Secondary | ICD-10-CM | POA: Insufficient documentation

## 2015-09-06 DIAGNOSIS — I70248 Atherosclerosis of native arteries of left leg with ulceration of other part of lower left leg: Secondary | ICD-10-CM | POA: Insufficient documentation

## 2016-07-18 ENCOUNTER — Encounter (HOSPITAL_COMMUNITY): Payer: Medicare Other

## 2016-07-18 ENCOUNTER — Encounter (HOSPITAL_BASED_OUTPATIENT_CLINIC_OR_DEPARTMENT_OTHER): Payer: Medicare Other | Attending: Internal Medicine

## 2016-07-18 ENCOUNTER — Encounter: Payer: Medicare Other | Admitting: Vascular Surgery

## 2016-07-18 DIAGNOSIS — G309 Alzheimer's disease, unspecified: Secondary | ICD-10-CM | POA: Insufficient documentation

## 2016-07-18 DIAGNOSIS — J449 Chronic obstructive pulmonary disease, unspecified: Secondary | ICD-10-CM | POA: Diagnosis not present

## 2016-07-18 DIAGNOSIS — I10 Essential (primary) hypertension: Secondary | ICD-10-CM | POA: Diagnosis not present

## 2016-07-18 DIAGNOSIS — F0281 Dementia in other diseases classified elsewhere with behavioral disturbance: Secondary | ICD-10-CM | POA: Diagnosis not present

## 2016-07-18 DIAGNOSIS — L97821 Non-pressure chronic ulcer of other part of left lower leg limited to breakdown of skin: Secondary | ICD-10-CM | POA: Diagnosis not present

## 2016-08-01 ENCOUNTER — Encounter (HOSPITAL_BASED_OUTPATIENT_CLINIC_OR_DEPARTMENT_OTHER): Payer: Medicare Other | Attending: Internal Medicine

## 2016-08-01 DIAGNOSIS — J449 Chronic obstructive pulmonary disease, unspecified: Secondary | ICD-10-CM | POA: Insufficient documentation

## 2016-08-01 DIAGNOSIS — I87323 Chronic venous hypertension (idiopathic) with inflammation of bilateral lower extremity: Secondary | ICD-10-CM | POA: Diagnosis not present

## 2016-08-01 DIAGNOSIS — L03116 Cellulitis of left lower limb: Secondary | ICD-10-CM | POA: Diagnosis not present

## 2016-08-01 DIAGNOSIS — S81812A Laceration without foreign body, left lower leg, initial encounter: Secondary | ICD-10-CM | POA: Insufficient documentation

## 2016-08-01 DIAGNOSIS — W228XXA Striking against or struck by other objects, initial encounter: Secondary | ICD-10-CM | POA: Diagnosis not present

## 2016-08-01 DIAGNOSIS — I10 Essential (primary) hypertension: Secondary | ICD-10-CM | POA: Diagnosis not present

## 2016-08-01 DIAGNOSIS — F0390 Unspecified dementia without behavioral disturbance: Secondary | ICD-10-CM | POA: Diagnosis not present

## 2016-08-15 DIAGNOSIS — I87323 Chronic venous hypertension (idiopathic) with inflammation of bilateral lower extremity: Secondary | ICD-10-CM | POA: Diagnosis not present

## 2016-08-29 ENCOUNTER — Encounter (HOSPITAL_BASED_OUTPATIENT_CLINIC_OR_DEPARTMENT_OTHER): Payer: Medicare Other | Attending: Internal Medicine

## 2016-08-29 DIAGNOSIS — L97822 Non-pressure chronic ulcer of other part of left lower leg with fat layer exposed: Secondary | ICD-10-CM | POA: Diagnosis not present

## 2016-08-29 DIAGNOSIS — I87323 Chronic venous hypertension (idiopathic) with inflammation of bilateral lower extremity: Secondary | ICD-10-CM | POA: Diagnosis not present

## 2016-09-12 DIAGNOSIS — I87323 Chronic venous hypertension (idiopathic) with inflammation of bilateral lower extremity: Secondary | ICD-10-CM | POA: Diagnosis not present

## 2016-09-26 DIAGNOSIS — I87323 Chronic venous hypertension (idiopathic) with inflammation of bilateral lower extremity: Secondary | ICD-10-CM | POA: Diagnosis not present

## 2016-10-10 ENCOUNTER — Encounter (HOSPITAL_BASED_OUTPATIENT_CLINIC_OR_DEPARTMENT_OTHER): Payer: Medicare Other | Attending: Internal Medicine

## 2016-10-10 DIAGNOSIS — L03116 Cellulitis of left lower limb: Secondary | ICD-10-CM | POA: Diagnosis not present

## 2016-10-10 DIAGNOSIS — I87332 Chronic venous hypertension (idiopathic) with ulcer and inflammation of left lower extremity: Secondary | ICD-10-CM | POA: Diagnosis present

## 2016-10-10 DIAGNOSIS — L97822 Non-pressure chronic ulcer of other part of left lower leg with fat layer exposed: Secondary | ICD-10-CM | POA: Insufficient documentation

## 2017-12-31 ENCOUNTER — Other Ambulatory Visit
Admission: RE | Admit: 2017-12-31 | Discharge: 2017-12-31 | Disposition: A | Discharge status: Home / Self Care | Payer: Medicare Other | Source: Ambulatory Visit | Attending: Internal Medicine | Admitting: Internal Medicine

## 2017-12-31 DIAGNOSIS — Z79899 Other long term (current) drug therapy: Secondary | ICD-10-CM | POA: Insufficient documentation

## 2017-12-31 LAB — CBC WITH DIFFERENTIAL/PLATELET
Basophils Absolute: 0.1 10*3/uL (ref 0–0.1)
Basophils Relative: 1 %
EOS PCT: 2 %
Eosinophils Absolute: 0.2 10*3/uL (ref 0–0.7)
HCT: 38.7 % (ref 35.0–47.0)
HEMOGLOBIN: 12.9 g/dL (ref 12.0–16.0)
LYMPHS PCT: 32 %
Lymphs Abs: 3.6 10*3/uL (ref 1.0–3.6)
MCH: 31.8 pg (ref 26.0–34.0)
MCHC: 33.2 g/dL (ref 32.0–36.0)
MCV: 95.6 fL (ref 80.0–100.0)
Monocytes Absolute: 0.7 10*3/uL (ref 0.2–0.9)
Monocytes Relative: 6 %
NEUTROS PCT: 59 %
Neutro Abs: 6.8 10*3/uL — ABNORMAL HIGH (ref 1.4–6.5)
PLATELETS: 211 10*3/uL (ref 150–440)
RBC: 4.05 MIL/uL (ref 3.80–5.20)
RDW: 12.5 % (ref 11.5–14.5)
WBC: 11.4 10*3/uL — AB (ref 3.6–11.0)

## 2017-12-31 LAB — BASIC METABOLIC PANEL
ANION GAP: 13 (ref 5–15)
BUN: 14 mg/dL (ref 8–23)
CHLORIDE: 101 mmol/L (ref 98–111)
CO2: 25 mmol/L (ref 22–32)
Calcium: 8.3 mg/dL — ABNORMAL LOW (ref 8.9–10.3)
Creatinine, Ser: 0.62 mg/dL (ref 0.44–1.00)
GFR calc Af Amer: >60 mL/min (ref >60)
GLUCOSE: 131 mg/dL — AB (ref 70–99)
POTASSIUM: 3.3 mmol/L — AB (ref 3.5–5.1)
Sodium: 139 mmol/L (ref 135–145)

## 2018-08-21 ENCOUNTER — Encounter (HOSPITAL_COMMUNITY): Payer: Self-pay | Admitting: Emergency Medicine

## 2018-08-21 ENCOUNTER — Inpatient Hospital Stay (HOSPITAL_COMMUNITY): Payer: Medicare Other

## 2018-08-21 ENCOUNTER — Emergency Department (HOSPITAL_COMMUNITY): Payer: Medicare Other

## 2018-08-21 ENCOUNTER — Inpatient Hospital Stay (HOSPITAL_COMMUNITY)
Admission: EM | Admit: 2018-08-21 | Discharge: 2018-08-24 | DRG: 064 | Disposition: A | Discharge status: Skilled Nursing Facility | Payer: Medicare Other | Attending: Internal Medicine | Admitting: Internal Medicine

## 2018-08-21 DIAGNOSIS — I69354 Hemiplegia and hemiparesis following cerebral infarction affecting left non-dominant side: Secondary | ICD-10-CM

## 2018-08-21 DIAGNOSIS — R9401 Abnormal electroencephalogram [EEG]: Secondary | ICD-10-CM | POA: Diagnosis not present

## 2018-08-21 DIAGNOSIS — R600 Localized edema: Secondary | ICD-10-CM | POA: Diagnosis not present

## 2018-08-21 DIAGNOSIS — I472 Ventricular tachycardia: Secondary | ICD-10-CM | POA: Diagnosis not present

## 2018-08-21 DIAGNOSIS — F419 Anxiety disorder, unspecified: Secondary | ICD-10-CM | POA: Diagnosis not present

## 2018-08-21 DIAGNOSIS — Z79899 Other long term (current) drug therapy: Secondary | ICD-10-CM | POA: Diagnosis not present

## 2018-08-21 DIAGNOSIS — Z66 Do not resuscitate: Secondary | ICD-10-CM | POA: Diagnosis present

## 2018-08-21 DIAGNOSIS — J449 Chronic obstructive pulmonary disease, unspecified: Secondary | ICD-10-CM | POA: Diagnosis present

## 2018-08-21 DIAGNOSIS — I10 Essential (primary) hypertension: Secondary | ICD-10-CM | POA: Diagnosis present

## 2018-08-21 DIAGNOSIS — F32A Depression, unspecified: Secondary | ICD-10-CM | POA: Diagnosis present

## 2018-08-21 DIAGNOSIS — M810 Age-related osteoporosis without current pathological fracture: Secondary | ICD-10-CM | POA: Diagnosis not present

## 2018-08-21 DIAGNOSIS — I639 Cerebral infarction, unspecified: Secondary | ICD-10-CM

## 2018-08-21 DIAGNOSIS — K219 Gastro-esophageal reflux disease without esophagitis: Secondary | ICD-10-CM | POA: Diagnosis present

## 2018-08-21 DIAGNOSIS — I63511 Cerebral infarction due to unspecified occlusion or stenosis of right middle cerebral artery: Secondary | ICD-10-CM | POA: Diagnosis present

## 2018-08-21 DIAGNOSIS — R402112 Coma scale, eyes open, never, at arrival to emergency department: Secondary | ICD-10-CM | POA: Diagnosis present

## 2018-08-21 DIAGNOSIS — F329 Major depressive disorder, single episode, unspecified: Secondary | ICD-10-CM | POA: Diagnosis not present

## 2018-08-21 DIAGNOSIS — Z7189 Other specified counseling: Secondary | ICD-10-CM | POA: Diagnosis not present

## 2018-08-21 DIAGNOSIS — G8929 Other chronic pain: Secondary | ICD-10-CM | POA: Diagnosis not present

## 2018-08-21 DIAGNOSIS — Z515 Encounter for palliative care: Secondary | ICD-10-CM | POA: Diagnosis present

## 2018-08-21 DIAGNOSIS — R402222 Coma scale, best verbal response, incomprehensible words, at arrival to emergency department: Secondary | ICD-10-CM | POA: Diagnosis present

## 2018-08-21 DIAGNOSIS — Z7951 Long term (current) use of inhaled steroids: Secondary | ICD-10-CM

## 2018-08-21 DIAGNOSIS — Z79890 Hormone replacement therapy (postmenopausal): Secondary | ICD-10-CM | POA: Diagnosis not present

## 2018-08-21 DIAGNOSIS — F0150 Vascular dementia without behavioral disturbance: Secondary | ICD-10-CM | POA: Diagnosis present

## 2018-08-21 DIAGNOSIS — R29721 NIHSS score 21: Secondary | ICD-10-CM | POA: Diagnosis not present

## 2018-08-21 DIAGNOSIS — F0390 Unspecified dementia without behavioral disturbance: Secondary | ICD-10-CM | POA: Diagnosis present

## 2018-08-21 DIAGNOSIS — R402342 Coma scale, best motor response, flexion withdrawal, at arrival to emergency department: Secondary | ICD-10-CM | POA: Diagnosis present

## 2018-08-21 DIAGNOSIS — R9431 Abnormal electrocardiogram [ECG] [EKG]: Secondary | ICD-10-CM | POA: Diagnosis present

## 2018-08-21 DIAGNOSIS — Z7982 Long term (current) use of aspirin: Secondary | ICD-10-CM

## 2018-08-21 LAB — DIFFERENTIAL
Abs Immature Granulocytes: 0.04 10*3/uL (ref 0.00–0.07)
Basophils Absolute: 0 10*3/uL (ref 0.0–0.1)
Basophils Relative: 0 %
EOS ABS: 0 10*3/uL (ref 0.0–0.5)
EOS PCT: 0 %
Immature Granulocytes: 0 %
Lymphocytes Relative: 34 %
Lymphs Abs: 3.3 10*3/uL (ref 0.7–4.0)
Monocytes Absolute: 0.6 10*3/uL (ref 0.1–1.0)
Monocytes Relative: 6 %
Neutro Abs: 5.9 10*3/uL (ref 1.7–7.7)
Neutrophils Relative %: 60 %

## 2018-08-21 LAB — COMPREHENSIVE METABOLIC PANEL
ALT: 19 U/L (ref 0–44)
AST: 23 U/L (ref 15–41)
Albumin: 3.9 g/dL (ref 3.5–5.0)
Alkaline Phosphatase: 32 U/L — ABNORMAL LOW (ref 38–126)
Anion gap: 11 (ref 5–15)
BUN: 16 mg/dL (ref 8–23)
CO2: 27 mmol/L (ref 22–32)
Calcium: 8.7 mg/dL — ABNORMAL LOW (ref 8.9–10.3)
Chloride: 106 mmol/L (ref 98–111)
Creatinine, Ser: 0.78 mg/dL (ref 0.44–1.00)
GFR calc Af Amer: >60 mL/min (ref >60)
GFR calc non Af Amer: >60 mL/min (ref >60)
Glucose, Bld: 108 mg/dL — ABNORMAL HIGH (ref 70–99)
POTASSIUM: 3.5 mmol/L (ref 3.5–5.1)
Sodium: 144 mmol/L (ref 135–145)
Total Bilirubin: 1 mg/dL (ref 0.3–1.2)
Total Protein: 6.9 g/dL (ref 6.5–8.1)

## 2018-08-21 LAB — CBC
HCT: 43.6 % (ref 36.0–46.0)
Hemoglobin: 13.3 g/dL (ref 12.0–15.0)
MCH: 29.8 pg (ref 26.0–34.0)
MCHC: 30.5 g/dL (ref 30.0–36.0)
MCV: 97.8 fL (ref 80.0–100.0)
PLATELETS: 237 10*3/uL (ref 150–400)
RBC: 4.46 MIL/uL (ref 3.87–5.11)
RDW: 11.9 % (ref 11.5–15.5)
WBC: 9.8 10*3/uL (ref 4.0–10.5)
nRBC: 0 % (ref 0.0–0.2)

## 2018-08-21 LAB — GLUCOSE, CAPILLARY: Glucose-Capillary: 87 mg/dL (ref 70–99)

## 2018-08-21 LAB — PROTIME-INR
INR: 1.16
Prothrombin Time: 14.7 seconds (ref 11.4–15.2)

## 2018-08-21 LAB — I-STAT TROPONIN, ED: Troponin i, poc: 0.04 ng/mL (ref 0.00–0.08)

## 2018-08-21 LAB — CBG MONITORING, ED: Glucose-Capillary: 95 mg/dL (ref 70–99)

## 2018-08-21 LAB — PHOSPHORUS: Phosphorus: 4.6 mg/dL (ref 2.5–4.6)

## 2018-08-21 LAB — MAGNESIUM: Magnesium: 2.2 mg/dL (ref 1.7–2.4)

## 2018-08-21 LAB — APTT: aPTT: 34 seconds (ref 24–36)

## 2018-08-21 MED ORDER — ACETAMINOPHEN 325 MG PO TABS: 650.0000 mg | ORAL_TABLET | ORAL | Status: DC | PRN

## 2018-08-21 MED ORDER — ONDANSETRON HCL 4 MG PO TABS: 4.0000 mg | ORAL_TABLET | Freq: Four times a day (QID) | ORAL | Status: DC | PRN

## 2018-08-21 MED ORDER — ASPIRIN 300 MG RE SUPP
300.0000 mg | Freq: Every day | RECTAL | Status: DC
  Administered 2018-08-21 – 2018-08-22 (×2): 300 mg via RECTAL
  Filled 2018-08-21 (×2): qty 1

## 2018-08-21 MED ORDER — ENOXAPARIN SODIUM 40 MG/0.4ML ~~LOC~~ SOLN: 40.0000 mg | SUBCUTANEOUS | Status: DC

## 2018-08-21 MED ORDER — ACETAMINOPHEN 160 MG/5ML PO SOLN: 650.0000 mg | ORAL | Status: DC | PRN

## 2018-08-21 MED ORDER — STROKE: EARLY STAGES OF RECOVERY BOOK
Freq: Once | Status: AC
  Administered 2018-08-21
  Filled 2018-08-21 (×2): qty 1

## 2018-08-21 MED ORDER — ASPIRIN 325 MG PO TABS
325.0000 mg | ORAL_TABLET | Freq: Every day | ORAL | Status: DC
  Administered 2018-08-23 – 2018-08-24 (×2): 325 mg via ORAL
  Filled 2018-08-21 (×2): qty 1

## 2018-08-21 MED ORDER — POTASSIUM CHLORIDE IN NACL 20-0.9 MEQ/L-% IV SOLN
INTRAVENOUS | Status: AC
  Administered 2018-08-21: via INTRAVENOUS
  Filled 2018-08-21: qty 1000

## 2018-08-21 MED ORDER — ENOXAPARIN SODIUM 30 MG/0.3ML ~~LOC~~ SOLN
30.0000 mg | SUBCUTANEOUS | Status: DC
  Administered 2018-08-21 – 2018-08-23 (×3): 30 mg via SUBCUTANEOUS
  Filled 2018-08-21 (×3): qty 0.3

## 2018-08-21 MED ORDER — ONDANSETRON HCL 4 MG/2ML IJ SOLN: 4.0000 mg | Freq: Four times a day (QID) | INTRAMUSCULAR | Status: DC | PRN

## 2018-08-21 MED ORDER — ACETAMINOPHEN 650 MG RE SUPP: 650.0000 mg | RECTAL | Status: DC | PRN

## 2018-08-21 NOTE — Consult Note (Addendum)
Referring Physician: Dr. Clearnce Sorrel    Chief Complaint: Left sided weakness and AMS  HPI: Lisa Watts is an 85 y.o. female who [facility] initially to the [facility] via [facility] for acute onset of left sided weakness and AMS. A call by the ED staff to her place of residence [facility] and [employer]) regarding Lisa Watts revealed that her CNA found her at 0730 foaming at the mouth and then noted that her LUE was flaccid at 0930.    Code Stroke was initiated at the [facility] ED. CT head revealed age-indeterminate right thalamus and right basal ganglia infarct; an old left basal ganglia and thalamus infarct, a new chronic appearing left occipital lobe/PCA territory infarct an old small left cerebral artery infarct and advanced temporal lobe atrophy. No aggressive intervention was suggested by Teleneurology consult, per EDP note.   Her PMHx includes anxiety, depression, chronic pain, COPD, vascular dementia, essential hypertension, GERD, history of GI bleed, history of left lower extremity cellulitis, osteoarthritis, history of osteoporosis and left femoral fracture.  Past Medical History:  Diagnosis Date  . Anxiety and depression   . Chronic pain   . COPD (chronic obstructive pulmonary disease) (HCC)   . Dementia (HCC)   . Essential hypertension   . GERD (gastroesophageal reflux disease)   . History of GI bleed 2011  . Left leg cellulitis   . Osteoarthritis   . Osteoporosis with fracture     Past Surgical History:  Procedure Laterality Date  . CHOLECYSTECTOMY    . FEMUR IM NAIL Left 03/21/2013   Procedure: INTRAMEDULLARY (IM) NAIL FEMORAL;  Surgeon: Verlee Rossetti, MD;  Location: [facility] OR;  Service: Orthopedics;  Laterality: Left;    History reviewed. No pertinent family history. Social History:  reports that she has never smoked. She does not have any smokeless tobacco history on file. She reports that she does not drink alcohol or use drugs.  Allergies: No Known Allergies  Home  Medications:    . aspirin  300 mg Rectal Daily   Or  . aspirin  325 mg Oral Daily  . enoxaparin (LOVENOX) injection  30 mg Subcutaneous Q24H     ROS: Unable to obtain due to AMS/dementia.  Physical Examination: Blood pressure (!) 113/47, pulse 71, temperature 97.8 F (36.6 C), temperature source Oral, resp. rate 20, height  (1.727 m), weight 61.8 kg, SpO2 96 %.  HEENT: [place]/AT Lungs: Respirations unlabored Ext: No edema  Neurologic Examination: Ment: Speech is fluent with paucity of ideas and severely impaired orientation (to self only). Perseverates "I love you, will you love me" multiple times during the exam. Unable to follow instructions for naming and keeps eyes closed, resisting passive eye opening.  CN: Keeps eyes tightly closed. Left pupil round and reactive. Unable to assess right pupil due to tight eye closure. Unable to test blink to threat, but attempts to close left eye more tightly in response to penlight. Reacts to tactile stimulation bilaterally. Decreased width of labial fissure on the left. Will not protrude tongue to command. Head is midline.  Motor/Sensory:  LUE: No movement to any stimulus. Drops to bed immediately after release. Increased tone.  LLE: Triple flexion response to plantar stimulation. Increased tone.  RUE: 4+/5 movements against resistance, purposeful movement but has difficulty following motor commands. Reacts to pinch.  RLE: 5/5 purposeful movements to plantar stimulation.  Reflexes: 3+ right biceps and brachioradialis. 1+ left biceps and brachioradialis. 3+ patellae bilaterally. 0 achilles bilaterally. Right toe equivocal, left  toe upgoing.  Cerebellar/Gait: Unable to assess.   Results for orders placed or performed during the hospital encounter of 08/21/18 (from the past 48 hour(s))  Protime-INR     Status: None   Collection Time: 08/21/18  5:00 PM  Result Value Ref Range   Prothrombin Time 14.7 11.4 - 15.2 seconds   INR 1.16     Comment:  Performed at [facility], [address], [place], [place] [address]  APTT     Status: None   Collection Time: 08/21/18  5:00 PM  Result Value Ref Range   aPTT 34 24 - 36 seconds    Comment: Performed at [facility], [address], [place], [place] [address]  CBC     Status: None   Collection Time: 08/21/18  5:00 PM  Result Value Ref Range   WBC 9.8 4.0 - 10.5 K/uL   RBC 4.46 3.87 - 5.11 MIL/uL   Hemoglobin 13.3 12.0 - 15.0 g/dL   HCT 19.1 47.8 - 29.5 %   MCV 97.8 80.0 - 100.0 fL   MCH 29.8 26.0 - 34.0 pg   MCHC 30.5 30.0 - 36.0 g/dL   RDW 62.1 30.8 - 65.7 %   Platelets 237 150 - 400 K/uL   nRBC 0.0 0.0 - 0.2 %    Comment: Performed at [facility], [address] [place], [place] [address]  Differential     Status: None   Collection Time: 08/21/18  5:00 PM  Result Value Ref Range   Neutrophils Relative % 60 %   Neutro Abs 5.9 1.7 - 7.7 K/uL   Lymphocytes Relative 34 %   Lymphs Abs 3.3 0.7 - 4.0 K/uL   Monocytes Relative 6 %   Monocytes Absolute 0.6 0.1 - 1.0 K/uL   Eosinophils Relative 0 %   Eosinophils Absolute 0.0 0.0 - 0.5 K/uL   Basophils Relative 0 %   Basophils Absolute 0.0 0.0 - 0.1 K/uL   Immature Granulocytes 0 %   Abs Immature Granulocytes 0.04 0.00 - 0.07 K/uL    Comment: Performed at [facility], [address], [place], [place] [address]  Comprehensive metabolic panel     Status: Abnormal   Collection Time: 08/21/18  5:00 PM  Result Value Ref Range   Sodium 144 135 - 145 mmol/L   Potassium 3.5 3.5 - 5.1 mmol/L   Chloride 106 98 - 111 mmol/L   CO2 27 22 - 32 mmol/L   Glucose, Bld 108 (H) 70 - 99 mg/dL   BUN 16 8 - 23 mg/dL   Creatinine, Ser 4.13 0.44 - 1.00 mg/dL   Calcium 8.7 (L) 8.9 - 10.3 mg/dL   Total Protein 6.9 6.5 - 8.1 g/dL   Albumin 3.9 3.5 - 5.0 g/dL   AST 23 15 - 41 U/L   ALT 19 0 - 44 U/L   Alkaline Phosphatase 32 (L) 38 - 126 U/L   Total Bilirubin 1.0 0.3 - 1.2 mg/dL   GFR calc non Af Amer >60 >60 mL/min   GFR calc Af Amer >60 >60  mL/min   Anion gap 11 5 - 15    Comment: Performed at [facility], [address], [place], [place] [address]  Magnesium     Status: None   Collection Time: 08/21/18  5:00 PM  Result Value Ref Range   Magnesium 2.2 1.7 - 2.4 mg/dL    Comment: Performed at [facility], [address], [place], [place] [address]  Phosphorus  Status: None   Collection Time: 08/21/18  5:00 PM  Result Value Ref Range   Phosphorus 4.6 2.5 - 4.6 mg/dL    Comment: Performed at [facility], [address], [place], [place] [address]  CBG monitoring, ED     Status: None   Collection Time: 08/21/18  5:01 PM  Result Value Ref Range   Glucose-Capillary 95 70 - 99 mg/dL  I-stat troponin, ED     Status: None   Collection Time: 08/21/18  5:08 PM  Result Value Ref Range   Troponin i, poc 0.04 0.00 - 0.08 ng/mL   Comment 3            Comment: Due to the release kinetics of cTnI, a negative result within the first hours of the onset of symptoms does not rule out myocardial infarction with certainty. If myocardial infarction is still suspected, repeat the test at appropriate intervals.   Glucose, capillary     Status: None   Collection Time: 08/21/18 11:38 PM  Result Value Ref Range   Glucose-Capillary 87 70 - 99 mg/dL   Dg Chest Port 1 View  Result Date: 08/21/2018 CLINICAL DATA:  Left-sided weakness.  Altered mental status EXAM: PORTABLE CHEST 1 VIEW COMPARISON:  03/23/2013 FINDINGS: Cardiomegaly with vascular congestion. Interstitial prominence could reflect early interstitial edema. No confluent opacities or effusions. No acute bony abnormality. IMPRESSION: Cardiomegaly with vascular congestion and possible early interstitial edema. Electronically Signed   By: Charlett Nose M.D.   On: 08/21/2018 20:56   Ct Head Code Stroke Wo Contrast  Result Date: 08/21/2018 CLINICAL DATA:  Code stroke. Found down. LEFT-sided weakness and LEFT facial droop. History of hypertension and dementia. EXAM: CT HEAD WITHOUT  CONTRAST TECHNIQUE: Contiguous axial images were obtained from the base of the skull through the vertex without intravenous contrast. COMPARISON:  CT HEAD March 20, 2013 FINDINGS: BRAIN: New RIGHT thalamus and RIGHT basal ganglia infarcts. Old LEFT basal ganglia and thalamus infarcts. Small area LEFT occipital lobe encephalomalacia is new. Old small LEFT cerebellar infarct. No intraparenchymal hemorrhage, mass effect nor midline shift. Moderate to severe parenchymal brain volume loss with disproportion mesial temporal lobe atrophy. No hydrocephalus. Patchy supratentorial white matter hypodensities within normal range for patient's age, though non-specific are most compatible with chronic small vessel ischemic disease. No acute large vascular territory infarcts. No abnormal extra-axial fluid collections. Basal cisterns are patent. VASCULAR: Moderate calcific atherosclerosis of the carotid siphons. SKULL: No skull fracture. No significant scalp soft tissue swelling. SINUSES/ORBITS: Paranasal sinuses are well aerated. Mastoid air cells are well aerated.The included ocular globes and orbital contents are non-suspicious. Status post RIGHT ocular lens implant. OTHER: None. ASPECTS (Alberta Stroke Program Early CT Score) - Ganglionic level infarction (caudate, lentiform nuclei, internal capsule, insula, M1-M3 cortex): 6 - Supraganglionic infarction (M4-M6 cortex): 3 Total score (0-10 with 10 being normal): 9 IMPRESSION: 1. New age indeterminate RIGHT basal ganglia and RIGHT thalamus infarcts. 2. ASPECTS is 9. 3. Old LEFT basal ganglia and thalamus infarcts. New chronic appearing LEFT occipital lobe/PCA territory infarct. Old small LEFT cerebellar infarct. 4. Advanced temporal lobe atrophy seen with neuro degenerative syndromes. 5. Critical Value/emergent results were called by telephone at the time of interpretation on 08/21/2018 at 5:02 pm to Dr. Adriana Simas , who verbally acknowledged these results. Electronically Signed    By: Awilda Metro M.D.   On: 08/21/2018 17:02   MRI brain/MRA head: 1. Acute/early subacute infarction involving the right lateral parietal lobe with extension into the right  temporal and occipital lobes as well as the right lentiform and caudate nuclei. No hemorrhage or mass effect. 2. Multiple small chronic infarcts are present within the cerebellum and basal ganglia. 3. Advanced chronic microvascular ischemic changes and volume loss of the brain. 4. Asymmetric volume loss of anterior temporal lobes may represent neurodegenerative disease such as Alzheimer's or frontotemporal lobar degeneration. 5. Severe right M1 stenosis with asymmetric diminished flow related signal in the right MCA distribution. 6. Tandem segments of moderate to severe stenosis in bilateral proximal PCA. 7. No intracranial large vessel occlusion identified.  Assessment: 85 y.o. female presenting with acute onset of left sided weakness and AMS. 1. CT head: New age indeterminate RIGHT basal ganglia and RIGHT thalamus infarcts.  2. Also seen on CT head are old LEFT basal ganglia and thalamus infarcts, new chronic appearing LEFT occipital lobe/PCA territory infarct, old small LEFT cerebellar infarct and advanced temporal lobe atrophy. 3. MRI brain: Acute/early subacute infarction involving the right lateral parietal lobe with extension into the right temporal and occipital lobes as well as the right lentiform and caudate nuclei. No hemorrhage or mass effect. 4. MRA head: Severe right M1 stenosis with asymmetric diminished flow related signal in the right MCA distribution. Tandem segments of moderate to severe stenosis in bilateral proximal PCA. No LVO.  5. Classifiable as having failed ASA monotherapy.  6. Vascular dementia.  7. Stroke Risk Factors - HTN and evidence for prior strokes on CT head.   Recommendations: 1. Carotid ultrasound 2. TTE 3. Cardiac telemetry 4. PT consult, OT consult, Speech consult 5.  Prophylactic therapy- Add Plavix to ASA 6. HgbA1c, fasting lipid panel 7. Risk factor modification 8. Telemetry monitoring 9. Frequent neuro checks 10. Benefits of statin most likely outweighed by risks given the patient's advanced age 54. Given advanced age, use modified permissive HTN protocol, correcting SBP if > 180  12. Continue Aricept  @Electronically  signed: Dr. Caryl Pina 08/21/2018, 11:58 PM

## 2018-08-21 NOTE — ED Provider Notes (Signed)
[facility] EMERGENCY DEPARTMENT Provider Note   CSN: [id number] Arrival date & time: 08/21/18  1644  An emergency department physician performed an initial assessment on this suspected stroke patient at 1644.  History   Chief Complaint Chief Complaint  Patient presents with  . Code Stroke    HPI Lisa Watts is a 85 y.o. female.     Level 5 caveat for dementia and acuity of condition.  Patient presents with left-sided weakness and altered mental status.  Last normal unknown.  Nursing notes state patient was foaming at the mouth at 0730 today.  Left arm was flaccid on 0930.  No other history available at this time.     Past Medical History:  Diagnosis Date  . Anxiety and depression   . Chronic pain   . COPD (chronic obstructive pulmonary disease) (HCC)   . Dementia (HCC)   . Essential hypertension   . GERD (gastroesophageal reflux disease)   . History of GI bleed 2011  . Left leg cellulitis   . Osteoarthritis   . Osteoporosis with fracture     Patient Active Problem List   Diagnosis Date Noted  . Subtrochanteric fracture of left femur (HCC) 03/21/2013  . ANEMIA, IRON DEFICIENCY 01/02/2010    Past Surgical History:  Procedure Laterality Date  . CHOLECYSTECTOMY    . FEMUR IM NAIL Left 03/21/2013   Procedure: INTRAMEDULLARY (IM) NAIL FEMORAL;  Surgeon: Verlee Rossetti, MD;  Location: [facility] OR;  Service: Orthopedics;  Laterality: Left;     OB History   No obstetric history on file.      Home Medications    Prior to Admission medications   Medication Sig Start Date End Date Taking? Authorizing Provider  acetaminophen (TYLENOL) 500 MG tablet Take 2 tablets (1,000 mg total) by mouth 2 (two) times daily. 03/25/13   Ghimire, Werner Lean, MD  acetaminophen (TYLENOL) 650 MG CR tablet Take 650 mg by mouth as needed for pain.    [provider]  aspirin 81 MG tablet Take 81 mg by mouth daily.    [provider]  budesonide-formoterol (SYMBICORT)  160-4.5 MCG/ACT inhaler Inhale 2 puffs into the lungs 2 (two) times daily.    [provider]  Cholecalciferol (VITAMIN D3) 50000 units CAPS Take 1 capsule by mouth every 30 (thirty) days.    [provider]  donepezil (ARICEPT) 10 MG tablet Take 10 mg by mouth at bedtime.     [provider]  furosemide (LASIX) 40 MG tablet Take 40 mg by mouth daily.    [provider]  ipratropium-albuterol (DUONEB) 0.5-2.5 (3) MG/3ML SOLN Take 3 mLs by nebulization every 6 (six) hours as needed.    [provider]  LORazepam (ATIVAN) 0.5 MG tablet Take 0.25 mg by mouth 2 (two) times daily.    [provider]  metoprolol succinate (TOPROL-XL) 25 MG 24 hr tablet Take 12.5 mg by mouth daily.    [provider]  Multiple Vitamin (MULTIVITAMIN WITH MINERALS) TABS tablet Take 1 tablet by mouth daily.    [provider]  polyethylene glycol (MIRALAX / GLYCOLAX) packet Take 17 g by mouth 2 (two) times daily. 03/25/13   Ghimire, Werner Lean, MD  tiotropium (SPIRIVA) 18 MCG inhalation capsule Place 18 mcg into inhaler and inhale daily.    [provider]    Family History History reviewed. No pertinent family history.  Social History Social History   Tobacco Use  . Smoking status: Never Smoker  Substance Use Topics  . Alcohol use: No    Alcohol/week: 0.0 standard drinks  . Drug use: No     Allergies   Patient has no known allergies.   Review of Systems Review of Systems  Unable to perform ROS: Acuity of condition     Physical Exam Updated Vital Signs BP (!) 116/56   Pulse 72   Temp 98 F (36.7 C) (Axillary)   Resp 16   Ht 5\' 8"  (1.727 m)   Wt 55.1 kg   SpO2 95%   BMI 18.47 kg/m   Physical Exam Vitals signs and nursing note reviewed.  Constitutional:      Appearance: She is well-developed.     Comments: Patient is not speaking.  She withdraws to pain when the IV was started in the right arm  HENT:     Head:  Normocephalic and atraumatic.  Eyes:     Conjunctiva/sclera: Conjunctivae normal.  Neck:     Musculoskeletal: Neck supple.  Cardiovascular:     Rate and Rhythm: Normal rate and regular rhythm.  Pulmonary:     Effort: Pulmonary effort is normal.     Breath sounds: Normal breath sounds.  Abdominal:     General: Bowel sounds are normal.     Palpations: Abdomen is soft.  Musculoskeletal:     Comments: unable  Skin:    General: Skin is warm and dry.  Neurological:     Comments: Left-sided arm/leg weakness per history, but unable to elicit a formal exam in the ED  Psychiatric:     Comments: unable      ED Treatments / Results  Labs (all labs ordered are listed, but only abnormal results are displayed) Labs Reviewed  COMPREHENSIVE METABOLIC PANEL - Abnormal; Notable for the following components:      Result Value   Glucose, Bld 108 (*)    Calcium 8.7 (*)    Alkaline Phosphatase 32 (*)    All other components within normal limits  PROTIME-INR  APTT  CBC  DIFFERENTIAL  CBG MONITORING, ED  I-STAT TROPONIN, ED    EKG EKG Interpretation  Date/Time:  Saturday August 21 2018 17:00:39 EST Ventricular Rate:  76 PR Interval:    QRS Duration: 92 QT Interval:  370 QTC Calculation: 416 R Axis:   -26 Text Interpretation:  Sinus rhythm Probable left atrial enlargement Borderline left axis deviation Repol abnrm, severe global ischemia (LM/MVD) Confirmed by Donnetta Hutching ([id number]) on 08/21/2018 5:52:45 PM   Radiology Ct Head Code Stroke Wo Contrast  Result Date: 08/21/2018 CLINICAL DATA:  Code stroke. Found down. LEFT-sided weakness and LEFT facial droop. History of hypertension and dementia. EXAM: CT HEAD WITHOUT CONTRAST TECHNIQUE: Contiguous axial images were obtained from the base of the skull through the vertex without intravenous contrast. COMPARISON:  CT HEAD March 20, 2013 FINDINGS: BRAIN: New RIGHT thalamus and RIGHT basal ganglia infarcts. Old LEFT basal ganglia and  thalamus infarcts. Small area LEFT occipital lobe encephalomalacia is new. Old small LEFT cerebellar infarct. No intraparenchymal hemorrhage, mass effect nor midline shift. Moderate to severe parenchymal brain volume loss with disproportion mesial temporal lobe atrophy. No hydrocephalus. Patchy supratentorial white matter hypodensities within normal range for patient's age, though non-specific are most compatible with chronic small vessel ischemic disease. No acute large vascular territory infarcts. No abnormal extra-axial fluid collections. Basal cisterns are patent. VASCULAR: Moderate calcific atherosclerosis of the carotid siphons. SKULL: No skull fracture. No significant scalp soft tissue swelling. SINUSES/ORBITS: Paranasal sinuses are well  aerated. Mastoid air cells are well aerated.The included ocular globes and orbital contents are non-suspicious. Status post RIGHT ocular lens implant. OTHER: None. ASPECTS (Alberta Stroke Program Early CT Score) - Ganglionic level infarction (caudate, lentiform nuclei, internal capsule, insula, M1-M3 cortex): 6 - Supraganglionic infarction (M4-M6 cortex): 3 Total score (0-10 with 10 being normal): 9 IMPRESSION: 1. New age indeterminate RIGHT basal ganglia and RIGHT thalamus infarcts. 2. ASPECTS is 9. 3. Old LEFT basal ganglia and thalamus infarcts. New chronic appearing LEFT occipital lobe/PCA territory infarct. Old small LEFT cerebellar infarct. 4. Advanced temporal lobe atrophy seen with neuro degenerative syndromes. 5. Critical Value/emergent results were called by telephone at the time of interpretation on 08/21/2018 at 5:02 pm to Dr. Adriana Simas , who verbally acknowledged these results. Electronically Signed   By: Awilda Metro M.D.   On: 08/21/2018 17:02    Procedures Procedures (including critical care time)  Medications Ordered in ED Medications - No data to display   Initial Impression / Assessment and Plan / ED Course  I have reviewed the triage vital signs  and the nursing notes.  Pertinent labs & imaging results that were available during my care of the patient were reviewed by me and considered in my medical decision making (see chart for details).        Code stroke initiated for left-sided weakness and altered mental status.  Uncertain last normal.  CT scan reveals age-indeterminate right thalamus and right basal ganglia infarct.  No aggressive intervention suggested by neurology consult.  Will admit to general medicine.   CRITICAL CARE Performed by: Donnetta Hutching Total critical care time: 30 minutes Critical care time was exclusive of separately billable procedures and treating other patients. Critical care was necessary to treat or prevent imminent or life-threatening deterioration. Critical care was time spent personally by me on the following activities: development of treatment plan with patient and/or surrogate as well as nursing, discussions with consultants, evaluation of patient's response to treatment, examination of patient, obtaining history from patient or surrogate, ordering and performing treatments and interventions, ordering and review of laboratory studies, ordering and review of radiographic studies, pulse oximetry and re-evaluation of patient's condition.  Final Clinical Impressions(s) / ED Diagnoses   Final diagnoses:  Cerebrovascular accident (CVA), unspecified mechanism [facility])    ED Discharge Orders    None       Donnetta Hutching, MD 08/21/18 1932

## 2018-08-21 NOTE — ED Notes (Signed)
ED TO INPATIENT HANDOFF REPORT  Name/Age/Gender Lisa Watts 85 y.o. female  Code Status    Code Status Orders  (From admission, onward)         Start     Ordered   08/21/18 2019  Full code  Continuous     08/21/18 2031        Code Status History    Date Active Date Inactive Code Status Order ID Comments User Context   03/23/2013 1140 03/25/2013 2151 Full Code [id number]  Maretta Bees, MD Inpatient   03/21/2013 1956 03/23/2013 1140 Full Code [id number]  Verlee Rossetti, MD Inpatient   03/21/2013 0440 03/21/2013 1956 Full Code [id number]  Hillary Bow, DO Inpatient      Home/SNF/Other Nursing Home  Chief Complaint stroke  Level of Care/Admitting Diagnosis ED Disposition    ED Disposition Condition Comment   Admit  Hospital Area: [facility] [[id number]]  Level of Care: Medical Telemetry [104]  Diagnosis: Acute CVA (cerebrovascular accident) [facility]) [[id number]]  Admitting Physician: Bobette Mo [[id number]]  Attending Physician: Bobette Mo [[id number]]  Estimated length of stay: past midnight tomorrow  Certification:: I certify this patient will need inpatient services for at least 2 midnights  PT Class (Do Not Modify): Inpatient [101]  PT Acc Code (Do Not Modify): Private [1]       Medical History Past Medical History:  Diagnosis Date  . Anxiety and depression   . Chronic pain   . COPD (chronic obstructive pulmonary disease) (HCC)   . Dementia (HCC)   . Essential hypertension   . GERD (gastroesophageal reflux disease)   . History of GI bleed 2011  . Left leg cellulitis   . Osteoarthritis   . Osteoporosis with fracture     Allergies No Known Allergies  IV Location/Drains/Wounds Patient Lines/Drains/Airways Status   Active Line/Drains/Airways    Name:   Placement date:   Placement time:   Site:   Days:   Peripheral IV 08/21/18 Right Wrist   08/21/18    1709    Wrist   less than 1   Peripheral IV 08/21/18 Right Antecubital    08/21/18    1710    Antecubital   less than 1   Incision 03/21/13 Hip Left   03/21/13    1756     1979          Labs/Imaging Results for orders placed or performed during the hospital encounter of 08/21/18 (from the past 48 hour(s))  Protime-INR     Status: None   Collection Time: 08/21/18  5:00 PM  Result Value Ref Range   Prothrombin Time 14.7 11.4 - 15.2 seconds   INR 1.16     Comment: Performed at [facility], [address] [place], [place] [address]  APTT     Status: None   Collection Time: 08/21/18  5:00 PM  Result Value Ref Range   aPTT 34 24 - 36 seconds    Comment: Performed at [facility], [address] [place], [place] [address]  CBC     Status: None   Collection Time: 08/21/18  5:00 PM  Result Value Ref Range   WBC 9.8 4.0 - 10.5 K/uL   RBC 4.46 3.87 - 5.11 MIL/uL   Hemoglobin 13.3 12.0 - 15.0 g/dL   HCT 36.6 44.0 - 34.7 %   MCV 97.8 80.0 - 100.0 fL   MCH 29.8 26.0 - 34.0 pg   MCHC 30.5 30.0 -  36.0 g/dL   RDW 40.3 75.4 - 36.0 %   Platelets 237 150 - 400 K/uL   nRBC 0.0 0.0 - 0.2 %    Comment: Performed at [facility], [address] [place], [place] [address]  Differential     Status: None   Collection Time: 08/21/18  5:00 PM  Result Value Ref Range   Neutrophils Relative % 60 %   Neutro Abs 5.9 1.7 - 7.7 K/uL   Lymphocytes Relative 34 %   Lymphs Abs 3.3 0.7 - 4.0 K/uL   Monocytes Relative 6 %   Monocytes Absolute 0.6 0.1 - 1.0 K/uL   Eosinophils Relative 0 %   Eosinophils Absolute 0.0 0.0 - 0.5 K/uL   Basophils Relative 0 %   Basophils Absolute 0.0 0.0 - 0.1 K/uL   Immature Granulocytes 0 %   Abs Immature Granulocytes 0.04 0.00 - 0.07 K/uL    Comment: Performed at [facility], [address] [place], [place] [address]  Comprehensive metabolic panel     Status: Abnormal   Collection Time: 08/21/18  5:00 PM  Result Value Ref Range   Sodium 144 135 - 145 mmol/L   Potassium 3.5 3.5 - 5.1 mmol/L   Chloride 106 98 - 111 mmol/L   CO2 27 22 - 32  mmol/L   Glucose, Bld 108 (H) 70 - 99 mg/dL   BUN 16 8 - 23 mg/dL   Creatinine, Ser 4.81 0.44 - 1.00 mg/dL   Calcium 8.7 (L) 8.9 - 10.3 mg/dL   Total Protein 6.9 6.5 - 8.1 g/dL   Albumin 3.9 3.5 - 5.0 g/dL   AST 23 15 - 41 U/L   ALT 19 0 - 44 U/L   Alkaline Phosphatase 32 (L) 38 - 126 U/L   Total Bilirubin 1.0 0.3 - 1.2 mg/dL   GFR calc non Af Amer >60 >60 mL/min   GFR calc Af Amer >60 >60 mL/min   Anion gap 11 5 - 15    Comment: Performed at [facility], [address] [place], [place] [address]  CBG monitoring, ED     Status: None   Collection Time: 08/21/18  5:01 PM  Result Value Ref Range   Glucose-Capillary 95 70 - 99 mg/dL  I-stat troponin, ED     Status: None   Collection Time: 08/21/18  5:08 PM  Result Value Ref Range   Troponin i, poc 0.04 0.00 - 0.08 ng/mL   Comment 3            Comment: Due to the release kinetics of cTnI, a negative result within the first hours of the onset of symptoms does not rule out myocardial infarction with certainty. If myocardial infarction is still suspected, repeat the test at appropriate intervals.    Ct Head Code Stroke Wo Contrast  Result Date: 08/21/2018 CLINICAL DATA:  Code stroke. Found down. LEFT-sided weakness and LEFT facial droop. History of hypertension and dementia. EXAM: CT HEAD WITHOUT CONTRAST TECHNIQUE: Contiguous axial images were obtained from the base of the skull through the vertex without intravenous contrast. COMPARISON:  CT HEAD March 20, 2013 FINDINGS: BRAIN: New RIGHT thalamus and RIGHT basal ganglia infarcts. Old LEFT basal ganglia and thalamus infarcts. Small area LEFT occipital lobe encephalomalacia is new. Old small LEFT cerebellar infarct. No intraparenchymal hemorrhage, mass effect nor midline shift. Moderate to severe parenchymal brain volume loss with disproportion mesial temporal lobe atrophy. No hydrocephalus. Patchy supratentorial white matter hypodensities within normal range for patient's age, though  non-specific are most compatible with chronic small vessel ischemic disease. No acute large vascular territory infarcts. No abnormal extra-axial fluid collections. Basal cisterns are patent. VASCULAR: Moderate calcific atherosclerosis of the carotid siphons. SKULL: No skull fracture. No significant scalp soft tissue swelling. SINUSES/ORBITS: Paranasal sinuses are well aerated. Mastoid air cells are well aerated.The included ocular globes and orbital contents are non-suspicious. Status post RIGHT ocular lens implant. OTHER: None. ASPECTS (Alberta Stroke Program Early CT Score) - Ganglionic level infarction (caudate, lentiform nuclei, internal capsule, insula, M1-M3 cortex): 6 - Supraganglionic infarction (M4-M6 cortex): 3 Total score (0-10 with 10 being normal): 9 IMPRESSION: 1. New age indeterminate RIGHT basal ganglia and RIGHT thalamus infarcts. 2. ASPECTS is 9. 3. Old LEFT basal ganglia and thalamus infarcts. New chronic appearing LEFT occipital lobe/PCA territory infarct. Old small LEFT cerebellar infarct. 4. Advanced temporal lobe atrophy seen with neuro degenerative syndromes. 5. Critical Value/emergent results were called by telephone at the time of interpretation on 08/21/2018 at 5:02 pm to Dr. Adriana Simas , who verbally acknowledged these results. Electronically Signed   By: Awilda Metro M.D.   On: 08/21/2018 17:02    Pending Labs Unresulted Labs (From admission, onward)    Start     Ordered   08/28/18 0500  Creatinine, serum  (enoxaparin (LOVENOX)    CrCl < 30 ml/min)  Weekly,   R    Comments:  while on enoxaparin therapy.    08/21/18 2031   08/22/18 0500  Hemoglobin A1c  Tomorrow morning,   R     08/21/18 2031   08/22/18 0500  Lipid panel  Tomorrow morning,   R    Comments:  Fasting    08/21/18 2031   08/21/18 2010  Magnesium  Add-on,   R     08/21/18 2009   08/21/18 2010  Phosphorus  Add-on,   R     08/21/18 2009          Vitals/Pain Today's Vitals   08/21/18 1815 08/21/18 1830  08/21/18 1845 08/21/18 1900  BP: 108/67 101/60 (!) 116/50 (!) 116/56  Pulse:  (!) 50 71 72  Resp: Temp:      TempSrc:      SpO2:  93% 99% 95%  Weight:      Height:        Isolation Precautions No active isolations  Medications Medications   stroke: mapping our early stages of recovery book (has no administration in time range)  acetaminophen (TYLENOL) tablet 650 mg (has no administration in time range)    Or  acetaminophen (TYLENOL) solution 650 mg (has no administration in time range)    Or  acetaminophen (TYLENOL) suppository 650 mg (has no administration in time range)  enoxaparin (LOVENOX) injection 30 mg (has no administration in time range)  aspirin suppository 300 mg (has no administration in time range)    Or  aspirin tablet 325 mg (has no administration in time range)    Mobility non-ambulatory

## 2018-08-21 NOTE — ED Triage Notes (Signed)
Pt brought in by ems for evaluation of left sided weakness and altered mental status.  Conflicting information from [facility] for [employer].  Initially said 1500, then 1000.  Called [place] at [facility] to confirm who states the CNA found the pt at 0730 foaming at the mouth and noted her left arm was flaccid at 0930.

## 2018-08-21 NOTE — ED Notes (Signed)
Spoke with Beverlyn Roux who is DSS supervisor.  She can make decisions regarding care and can be reached at [phone].

## 2018-08-21 NOTE — Progress Notes (Signed)
Code Stroke Times  1635 Call time  1636 Beeped 1643 EMS here  1646 Exam started  Delay getting order in Epic  1648 Exam finished, images sent 1650 Completed in Epic 1655 GR called - Misty Stanley

## 2018-08-21 NOTE — H&P (Signed)
History and Physical    Lisa Watts ZOX:[id number] DOB: 1924/12/16 DOA: 08/21/2018  PCP: Colon Branch, MD   Patient coming from: [facility].  I have personally briefly reviewed patient's old medical records in [facility]  Chief Complaint: AMS and left sided weakness.  HPI: Lisa Watts is a 85 y.o. female with medical history significant of anxiety and depression, chronic pain, COPD, vascular dementia, essential hypertension, GERD, history of GI bleed, history of left lower extremity cellulitis, osteoarthritis, history of osteoporosis and left femoral fracture who was sent from [facility] nursing home for evaluation of altered mental status with new left-sided weakness.  The patient has been obtunded for at least several hours and is unable to provide further information.  ED Course: Initial vital signs temperature 98 F, pulse 75, respirations 15, blood pressure 106/62 mmHg and O2 sat 97% on room air.  A code stroke was called.  Tele-neurology was consulted.  No medications were given in the ED. I discussed the case with [facility] Sharon Seller [phone]), who are the representatives for this patient at this time, about the patient's condition and findings.  Admission at [facility] with neurology work-up were offered with a disclosure that the MR imaging and neurology consult would be obtained on Monday.  However, they prefer to have a full work-up done on the [facility] as soon as possible.  We also discussed having palliative care medicine evaluate the patient.  Her CBC shows a white count of 9.8 with a normal differential, hemoglobin 13.3 g/dL and platelets 562.  PT, INR and APTT are normal.  I-STAT troponin, magnesium and phosphorus were normal.  Her CMP shows a glucose of 108 and calcium of 8.7 mg/dL.  Alkaline phosphatase of 33 units/L.  All other values are within normal limits.  Imaging: 1 view chest radiograph showed cardiomegaly with vascular congestion and  possible early interstitial edema.  CT head showed new age indeterminate right basal ganglia and right thalamus infarcts.  There is an old left basal ganglia and thalamus infarcts.  There is a new chronic appearing left occipital lobe/PCA territory infarct.  Old small left cerebral artery infarct.  There is advanced temporal lobe atrophy is seen with neural degenerative tree syndromes.  Please see images and full radiology report for further details.  Review of Systems: Unable to obtain.   Past Medical History:  Diagnosis Date  . Anxiety and depression   . Chronic pain   . COPD (chronic obstructive pulmonary disease) (HCC)   . Dementia (HCC)   . Essential hypertension   . GERD (gastroesophageal reflux disease)   . History of GI bleed 2011  . Left leg cellulitis   . Osteoarthritis   . Osteoporosis with fracture     Past Surgical History:  Procedure Laterality Date  . CHOLECYSTECTOMY    . FEMUR IM NAIL Left 03/21/2013   Procedure: INTRAMEDULLARY (IM) NAIL FEMORAL;  Surgeon: Verlee Rossetti, MD;  Location: [facility] OR;  Service: Orthopedics;  Laterality: Left;     reports that she has never smoked. She does not have any smokeless tobacco history on file. She reports that she does not drink alcohol or use drugs.  No Known Allergies  Unable to obtain family medical history.  Prior to Admission medications   Medication Sig Start Date End Date Taking? Authorizing Provider  acetaminophen (TYLENOL) 500 MG tablet Take 2 tablets (1,000 mg total) by mouth 2 (two) times daily. 03/25/13   Ghimire, Werner Lean, MD  acetaminophen (TYLENOL) 650 MG CR tablet Take 650 mg by mouth as needed for pain.    [provider]  aspirin 81 MG tablet Take 81 mg by mouth daily.    [provider]  budesonide-formoterol (SYMBICORT) 160-4.5 MCG/ACT inhaler Inhale 2 puffs into the lungs 2 (two) times daily.    [provider]  Cholecalciferol (VITAMIN D3) 50000 units CAPS Take 1 capsule by mouth  every 30 (thirty) days.    [provider]  donepezil (ARICEPT) 10 MG tablet Take 10 mg by mouth at bedtime.     [provider]  furosemide (LASIX) 40 MG tablet Take 40 mg by mouth daily.    [provider]  ipratropium-albuterol (DUONEB) 0.5-2.5 (3) MG/3ML SOLN Take 3 mLs by nebulization every 6 (six) hours as needed.    [provider]  LORazepam (ATIVAN) 0.5 MG tablet Take 0.25 mg by mouth 2 (two) times daily.    [provider]  metoprolol succinate (TOPROL-XL) 25 MG 24 hr tablet Take 12.5 mg by mouth daily.    [provider]  Multiple Vitamin (MULTIVITAMIN WITH MINERALS) TABS tablet Take 1 tablet by mouth daily.    [provider]  polyethylene glycol (MIRALAX / GLYCOLAX) packet Take 17 g by mouth 2 (two) times daily. 03/25/13   Ghimire, Werner Lean, MD  tiotropium (SPIRIVA) 18 MCG inhalation capsule Place 18 mcg into inhaler and inhale daily.    [provider]    Physical Exam: Vitals:   08/21/18 2015 08/21/18 2030 08/21/18 2045 08/21/18 2100  BP: 125/67 (!) 131/49 (!) 145/66 122/70  Pulse: 78 81 79 72  Resp: 15 17 16 15   Temp:      TempSrc:      SpO2: 97% 96% 97% 95%  Weight:      Height:        Constitutional: Obtunded, but in NAD. Eyes: 2 mm miosis, PERRL, lids and conjunctivae normal ENMT: Mucous membranes are moist. Posterior pharynx clear of any exudate or lesions. Neck: normal, supple, no masses, no thyromegaly Respiratory: clear to auscultation bilaterally, no wheezing, no crackles. Normal respiratory effort. No accessory muscle use.  Cardiovascular: Regular rate and rhythm, no murmurs / rubs / gallops. No extremity edema. 2+ pedal pulses. No carotid bruits.  Abdomen: Soft, no tenderness, no masses palpated. No hepatosplenomegaly. Bowel sounds positive.  Musculoskeletal: no clubbing / cyanosis.  Good ROM, no contractures. Normal muscle tone.  Skin: no gross rashes, lesions, ulcers on very limited  dermatological examination.  Unable to examine decubiti diarrhea. Neurologic: .  Unable to evaluate sensation, DTR normal.  2-3 over 5 left-sided hemiparesis, unable to perform gait examination then fully evaluate. Psychiatric: Obtunded.  Does not follow commands.  Responds only to touch or noxious stimuli.   Labs on Admission: I have personally reviewed following labs and imaging studies  CBC: Recent Labs  Lab 08/21/18 1700  WBC 9.8  NEUTROABS 5.9  HGB 13.3  HCT 43.6  MCV 97.8  PLT 237   Basic Metabolic Panel: Recent Labs  Lab 08/21/18 1700  NA 144  K 3.5  CL 106  CO2 27  GLUCOSE 108*  BUN 16  CREATININE 0.78  CALCIUM 8.7*  MG 2.2  PHOS 4.6   GFR: Estimated Creatinine Clearance: 39.8 mL/min (by C-G formula based on SCr of 0.78 mg/dL). Liver Function Tests: Recent Labs  Lab 08/21/18 1700  AST 23  ALT 19  ALKPHOS 32*  BILITOT 1.0  PROT 6.9  ALBUMIN 3.9  No results for input(s): LIPASE, AMYLASE in the last 168 hours. No results for input(s): AMMONIA in the last 168 hours. Coagulation Profile: Recent Labs  Lab 08/21/18 1700  INR 1.16   Cardiac Enzymes: No results for input(s): CKTOTAL, CKMB, CKMBINDEX, TROPONINI in the last 168 hours. BNP (last 3 results) No results for input(s): PROBNP in the last 8760 hours. HbA1C: No results for input(s): HGBA1C in the last 72 hours. CBG: Recent Labs  Lab 08/21/18 1701  GLUCAP 95   Lipid Profile: No results for input(s): CHOL, HDL, LDLCALC, TRIG, CHOLHDL, LDLDIRECT in the last 72 hours. Thyroid Function Tests: No results for input(s): TSH, T4TOTAL, FREET4, T3FREE, THYROIDAB in the last 72 hours. Anemia Panel: No results for input(s): VITAMINB12, FOLATE, FERRITIN, TIBC, IRON, RETICCTPCT in the last 72 hours. Urine analysis:    Component Value Date/Time   COLORURINE YELLOW 02/28/2010 1850   APPEARANCEUR HAZY (A) 02/28/2010 1850   LABSPEC >1.030 (H) 02/28/2010 1850   PHURINE 5.0 02/28/2010 1850   GLUCOSEU  NEGATIVE 02/28/2010 1850   HGBUR LARGE (A) 02/28/2010 1850   BILIRUBINUR SMALL (A) 02/28/2010 1850   KETONESUR TRACE (A) 02/28/2010 1850   PROTEINUR 30 (A) 02/28/2010 1850   UROBILINOGEN 0.2 02/28/2010 1850   NITRITE POSITIVE (A) 02/28/2010 1850   LEUKOCYTESUR NEGATIVE 02/28/2010 1850    Radiological Exams on Admission: Dg Chest Port 1 View  Result Date: 08/21/2018 CLINICAL DATA:  Left-sided weakness.  Altered mental status EXAM: PORTABLE CHEST 1 VIEW COMPARISON:  03/23/2013 FINDINGS: Cardiomegaly with vascular congestion. Interstitial prominence could reflect early interstitial edema. No confluent opacities or effusions. No acute bony abnormality. IMPRESSION: Cardiomegaly with vascular congestion and possible early interstitial edema. Electronically Signed   By: Charlett Nose M.D.   On: 08/21/2018 20:56   Ct Head Code Stroke Wo Contrast  Result Date: 08/21/2018 CLINICAL DATA:  Code stroke. Found down. LEFT-sided weakness and LEFT facial droop. History of hypertension and dementia. EXAM: CT HEAD WITHOUT CONTRAST TECHNIQUE: Contiguous axial images were obtained from the base of the skull through the vertex without intravenous contrast. COMPARISON:  CT HEAD March 20, 2013 FINDINGS: BRAIN: New RIGHT thalamus and RIGHT basal ganglia infarcts. Old LEFT basal ganglia and thalamus infarcts. Small area LEFT occipital lobe encephalomalacia is new. Old small LEFT cerebellar infarct. No intraparenchymal hemorrhage, mass effect nor midline shift. Moderate to severe parenchymal brain volume loss with disproportion mesial temporal lobe atrophy. No hydrocephalus. Patchy supratentorial white matter hypodensities within normal range for patient's age, though non-specific are most compatible with chronic small vessel ischemic disease. No acute large vascular territory infarcts. No abnormal extra-axial fluid collections. Basal cisterns are patent. VASCULAR: Moderate calcific atherosclerosis of the carotid siphons.  SKULL: No skull fracture. No significant scalp soft tissue swelling. SINUSES/ORBITS: Paranasal sinuses are well aerated. Mastoid air cells are well aerated.The included ocular globes and orbital contents are non-suspicious. Status post RIGHT ocular lens implant. OTHER: None. ASPECTS (Alberta Stroke Program Early CT Score) - Ganglionic level infarction (caudate, lentiform nuclei, internal capsule, insula, M1-M3 cortex): 6 - Supraganglionic infarction (M4-M6 cortex): 3 Total score (0-10 with 10 being normal): 9 IMPRESSION: 1. New age indeterminate RIGHT basal ganglia and RIGHT thalamus infarcts. 2. ASPECTS is 9. 3. Old LEFT basal ganglia and thalamus infarcts. New chronic appearing LEFT occipital lobe/PCA territory infarct. Old small LEFT cerebellar infarct. 4. Advanced temporal lobe atrophy seen with neuro degenerative syndromes. 5. Critical Value/emergent results were called by telephone at the time of interpretation on 08/21/2018 at 5:02 pm to  Dr. Adriana Simasook , who verbally acknowledged these results. Electronically Signed   By: Awilda Metroourtnay  Bloomer M.D.   On: 08/21/2018 17:02    EKG: Independently reviewed. Vent. rate 76 BPM PR interval * ms QRS duration 92 ms QT/QTc 370/416 ms P-R-T axes 52 -26 * Sinus rhythm Probable left atrial enlargement Borderline left axis deviation Repol abnrm, severe global ischemia (LM/MVD)  Assessment/Plan Principal Problem:   Acute CVA (cerebrovascular accident) (HCC) 85 year old female nursing home resident with a history of dementia, multiple recent and chronic subcortical strokes on CT head without contrast.  No family members as contacted and [facility] is the patient is representative.  Given patient's age, history of dementia and other comorbidities she has a poor prognosis.  The case was discussed with [facility] representative Sharon Sellerina Wyatt [phone]), who was made aware of the patient's condition.  They prefer for the patient to go to a tertiary care center, but given the  circumstances are amenable to a palliative care consult.   Plan: Admit to telemetry/inpatient. Frequent neuro checks. Keep n.p.o. for now. Swallow screen evaluation. SLP, PT and OT consult. Check fasting lipids and hemoglobin A1c. Check carotid Doppler. Check echocardiogram. Check MRI/MRA to brain. Inpatient neurology consult.  Active Problems:   Abnormal EKG Trend troponin levels. Check EKG in the morning. Check echocardiogram.    Dementia (HCC) Supportive care. Check B12 RPR and TSH level.    COPD (chronic obstructive pulmonary disease) (HCC) Continue supplemental oxygen. Unable to use Spiriva and Symbicort at this time. Nebulized bronchodilators as needed.    GERD (gastroesophageal reflux disease) Famotidine 20 mg IVP every 12 hours.    Essential hypertension Allow permissive hypertension.    Anxiety and depression Hold oral medications for now. Would only use low-dose IV benzodiazepine if needed.    DVT prophylaxis: Lovenox SQ. Code Status: Full code. Family Communication: No family available.  [facility] Sharon Sellerina Wyatt [phone]) who would like to be updated on the case. Disposition Plan: [facility] to [facility] for CVA work-up. Consults called: Tele-neurology was consulted. Admission status: Inpatient/telemetry.   Bobette Moavid Manuel Mehak Roskelley MD [facility]  08/21/2018, 10:18 PM

## 2018-08-22 ENCOUNTER — Inpatient Hospital Stay (HOSPITAL_COMMUNITY): Payer: Medicare Other

## 2018-08-22 ENCOUNTER — Other Ambulatory Visit: Payer: Self-pay

## 2018-08-22 DIAGNOSIS — R9431 Abnormal electrocardiogram [ECG] [EKG]: Secondary | ICD-10-CM | POA: Diagnosis present

## 2018-08-22 DIAGNOSIS — I639 Cerebral infarction, unspecified: Secondary | ICD-10-CM

## 2018-08-22 LAB — MRSA PCR SCREENING: MRSA by PCR: NEGATIVE

## 2018-08-22 LAB — VITAMIN B12: Vitamin B-12: 372 pg/mL (ref 180–914)

## 2018-08-22 LAB — GLUCOSE, CAPILLARY
Glucose-Capillary: 84 mg/dL (ref 70–99)
Glucose-Capillary: 90 mg/dL (ref 70–99)
Glucose-Capillary: 85 mg/dL (ref 70–99)

## 2018-08-22 LAB — RPR: RPR Ser Ql: NONREACTIVE

## 2018-08-22 LAB — TSH: TSH: 0.635 u[IU]/mL (ref 0.350–4.500)

## 2018-08-22 LAB — ECHOCARDIOGRAM COMPLETE
Height: 68 in
Weight: 2179.91 oz

## 2018-08-22 LAB — TROPONIN I
Troponin I: 0.03 ng/mL (ref <0.03)
Troponin I: 0.03 ng/mL (ref <0.03)

## 2018-08-22 MED ORDER — MOMETASONE FURO-FORMOTEROL FUM 200-5 MCG/ACT IN AERO
2.0000 | INHALATION_SPRAY | Freq: Two times a day (BID) | RESPIRATORY_TRACT | Status: DC
  Administered 2018-08-22 – 2018-08-24 (×5): 2 via RESPIRATORY_TRACT
  Filled 2018-08-22: qty 8.8

## 2018-08-22 MED ORDER — IPRATROPIUM-ALBUTEROL 0.5-2.5 (3) MG/3ML IN SOLN: 3.0000 mL | Freq: Four times a day (QID) | RESPIRATORY_TRACT | Status: DC | PRN

## 2018-08-22 MED ORDER — RESOURCE THICKENUP CLEAR PO POWD
ORAL | Status: DC | PRN
  Filled 2018-08-22: qty 125

## 2018-08-22 MED ORDER — ORAL CARE MOUTH RINSE
15.0000 mL | Freq: Two times a day (BID) | OROMUCOSAL | Status: DC
  Administered 2018-08-22 – 2018-08-24 (×6): 15 mL via OROMUCOSAL

## 2018-08-22 MED ORDER — CHLORHEXIDINE GLUCONATE 0.12 % MT SOLN
15.0000 mL | Freq: Two times a day (BID) | OROMUCOSAL | Status: DC
  Administered 2018-08-22 – 2018-08-24 (×5): 15 mL via OROMUCOSAL
  Filled 2018-08-22 (×4): qty 15

## 2018-08-22 MED ORDER — UMECLIDINIUM BROMIDE 62.5 MCG/INH IN AEPB
1.0000 | INHALATION_SPRAY | Freq: Every day | RESPIRATORY_TRACT | Status: DC
  Administered 2018-08-22 – 2018-08-24 (×3): 1 via RESPIRATORY_TRACT
  Filled 2018-08-22: qty 7

## 2018-08-22 NOTE — Progress Notes (Signed)
A 85 year old white female  A transfer from [facility]  Pt a residence of [facility] re[facility] was found  With mouth foaming and at 0030  Her CNA noted  Left side weakness   Was picked up by EMS and sent to [facility].   Pt confused and does not follow command  Initial assessments done Cardiac monitor in use. .  NPO as pt failed Swallow  Evaluation for not following Command.    Will continue to monitor.Pt.

## 2018-08-22 NOTE — Progress Notes (Signed)
SLP Cancellation Note  Patient Details Name: Lisa Watts MRN: [id number] DOB: 15-Dec-1924   Cancelled treatment:       Reason Eval/Treat Not Completed: Other (comment). Per staff at [facility], the pt's verbal output is typically limited and she is not very communicative. Speech/language/cognitive assessment was attempted but pt was unable to participate. Her response to multiple questions was, "I love you and you love me and that's the way it's supposed to be" or a slight variations thereof. Based on the reports from [facility], her speech/language/cognition skills appear to be at baseline. SLP will continue to follow pt for dysphagia.  Jared Cahn I. Vear Clock, MS, CCC-SLP Acute Rehabilitation Services Office number [phone] Pager [phone]  [name] 08/22/2018, 1:12 PM

## 2018-08-22 NOTE — Evaluation (Signed)
Clinical/Bedside Swallow Evaluation Patient Details  Name: Lisa Watts MRN: [id number] Date of Birth: Nov 21, 1924  Today's Date: 08/22/2018 Time: SLP Start Time (ACUTE ONLY): 1210 SLP Stop Time (ACUTE ONLY): 1235 SLP Time Calculation (min) (ACUTE ONLY): 25 min  Past Medical History:  Past Medical History:  Diagnosis Date  . Anxiety and depression   . Chronic pain   . COPD (chronic obstructive pulmonary disease) (HCC)   . Dementia (HCC)   . Essential hypertension   . GERD (gastroesophageal reflux disease)   . History of GI bleed 2011  . Left leg cellulitis   . Osteoarthritis   . Osteoporosis with fracture    Past Surgical History:  Past Surgical History:  Procedure Laterality Date  . CHOLECYSTECTOMY    . FEMUR IM NAIL Left 03/21/2013   Procedure: INTRAMEDULLARY (IM) NAIL FEMORAL;  Surgeon: Verlee Rossetti, MD;  Location: [facility] OR;  Service: Orthopedics;  Laterality: Left;   HPI:  [name] with medical history significant of anxiety and depression, chronic pain, COPD, vascular dementia, essential hypertension, GERD, history of GI bleed, history of left lower extremity cellulitis, osteoarthritis, history of osteoporosis and left femoral fracture who was sent from [facility] nursing home for evaluation of altered mental status with new left-sided weakness.  The MRI of 08/22/18 revealed acute/early subacute infarction involving the right lateral parietal lobe with extension into the right temporal and occipital lobes as well as the right lentiform and caudate nuclei. Pt's nurse from [facility] reported that the pt was on mechanical soft (ground) and thin liquids and she was being seen by speech pathology.    Assessment / Plan / Recommendation Clinical Impression  Pt was seen for bedside swallow evaluation. She was adequately alert for the evaluation but exhibited difficulty following commands secondary to reported advanced dementia. An oral mechanism exam therefore could  not be completed but oral inspection revealed an edentulous status. She tolerated puree solids and nectar thick liquids without overt s/sx of aspiration but exhibited coughing with thin liquids via tsp and straw. A modified barium swallow study is recommended to further assess the functional integrity of the swallow mechanism.  SLP Visit Diagnosis: Dysphagia, oropharyngeal phase (R13.12)    Aspiration Risk  Moderate aspiration risk    Diet Recommendation NPO   Medication Administration: Via alternative means    Other  Recommendations Oral Care Recommendations: Oral care QID;Staff/trained caregiver to provide oral care   Follow up Recommendations Skilled Nursing facility      Frequency and Duration min 2x/week  2 weeks       Prognosis Prognosis for Safe Diet Advancement: Fair Barriers to Reach Goals: Cognitive deficits      Swallow Study   General Date of Onset: 08/21/18 HPI: [name] with medical history significant of anxiety and depression, chronic pain, COPD, vascular dementia, essential hypertension, GERD, history of GI bleed, history of left lower extremity cellulitis, osteoarthritis, history of osteoporosis and left femoral fracture who was sent from [facility] nursing home for evaluation of altered mental status with new left-sided weakness.  The MRI of 08/22/18 revealed acute/early subacute infarction involving the right lateral parietal lobe with extension into the right temporal and occipital lobes as well as the right lentiform and caudate nuclei. Pt's nurse from [facility] reported that the pt was on mechanical soft (ground) and thin liquids and she was being seen by speech pathology.  Type of Study: Bedside Swallow Evaluation Previous Swallow Assessment:  None Diet Prior to this Study: NPO Respiratory Status: Room air History of Recent Intubation: No Behavior/Cognition: Alert;Cooperative;Pleasant mood;Confused;Requires cueing Oral Cavity Assessment:  Within Functional Limits Oral Care Completed by SLP: Yes Oral Cavity - Dentition: Edentulous Self-Feeding Abilities: Needs assist Patient Positioning: Upright in bed;Postural control adequate for testing Baseline Vocal Quality: Normal    Oral/Motor/Sensory Function Overall Oral Motor/Sensory Function: Other (comment)(Pt was unable to follow commands for assessment)   Ice Chips Ice chips: Within functional limits Presentation: Spoon   Thin Liquid Thin Liquid: Impaired Presentation: Spoon;Straw Pharyngeal  Phase Impairments: Throat Clearing - Immediate;Wet Vocal Quality    Nectar Thick Nectar Thick Liquid: Within functional limits Presentation: Straw   Honey Thick Honey Thick Liquid: Not tested   Puree Puree: Within functional limits   Solid   Jakalyn Kratky I. Vear Clock, MS, CCC-SLP Acute Rehabilitation Services Office number [phone] Pager [phone] Solid: Not tested      [name] 08/22/2018,1:19 PM

## 2018-08-22 NOTE — Progress Notes (Signed)
PROGRESS NOTE    JULETTA COSTELLA  UUE:[id number] DOB: 1925/03/14 DOA: 08/21/2018 PCP: Colon Branch, MD   Brief Narrative:  85 year old with past medical history relevant for dementia, anxiety/depression, COPD, hypertension, admitted from skilled nursing facility [facility] with left-sided weakness and altered mental status.   Assessment & Plan:   Principal Problem:   Acute CVA (cerebrovascular accident) (HCC) Active Problems:   COPD (chronic obstructive pulmonary disease) (HCC)   GERD (gastroesophageal reflux disease)   Essential hypertension   Anxiety and depression   Dementia (HCC)   Abnormal EKG   #) Left-sided weakness/CVA/altered mental status: MRI shows evidence of large right-sided stroke including parietal lobe, temporal and occipital lobes as well as chronic infarcts in the posterior circulation and brain loss.  Patient also has no signs of intracerebral vessels. -Neurology consult -Per neurology to add clopidogrel to aspirin as patient was already on aspirin -No statin per neurology -Pending hemoglobin A1c and lipid panel -PT/OT/speech consult - Echo pending, continue telemetry -Carotid ultrasound ordered - Neurochecks - Patient apparently has a legal guardian with [facility] Sharon Seller [phone])  #) Hypertension: Permissive hypertension for 24 hours - Hold metoprolol succinate 12.5 mg daily  #) COPD: -Continue L AMA -Continue ICS/LABA -Continue PRN short-acting bronchodilators  #) Lower extremity edema: -Hold furosemide 40 mg daily  #) Pain/psych/dementia: -Continue donepezil 10 mg nightly -Hold lorazepam 0.25 mg twice daily  Fluids: Tolerating p.o. Electrolytes: Monitor and supplement Nutrition: N.p.o. pending speech-language pathology eval  Prophylaxis: Enoxaparin  Disposition: Pending stroke work-up  Full code, will discuss with patient's healthcare power of attorney at social services  Consultants:    Neurology  Procedures:  Echo 08/22/2018 pending  Carotid Dopplers pending  Antimicrobials:   None   Subjective: This morning the patient is alert but not quite oriented.  It is unclear what her baseline is.  She does not report any complaints.  She does not appear to understand that she has significant left-sided deficits.  She denies any nausea, vomiting, diarrhea, cough, congestion, chest pain.  Objective: Vitals:   08/22/18 0400 08/22/18 0600 08/22/18 0739 08/22/18 0848  BP: 119/80 (!) 152/73 (!) 160/88   Pulse: 79  88 88  Resp: 18 19 20 18   Temp: 97.8 F (36.6 C) 98.1 F (36.7 C) 98.3 F (36.8 C)   TempSrc: Axillary Oral Axillary   SpO2: 98%  97% 97%  Weight:      Height:        Intake/Output Summary (Last 24 hours) at 08/22/2018 0924 Last data filed at 08/22/2018 0600 Gross per 24 hour  Intake 384.53 ml  Output 200 ml  Net 184.53 ml   Filed Weights   08/21/18 1706 08/21/18 2248  Weight: 55.1 kg 61.8 kg    Examination:  General exam: Appears calm and comfortable  Respiratory system: No increased work of breathing, diminished anterior lung sounds, no wheezes crackles, rhonchi. Cardiovascular system: Distant heart sounds, regular rate and rhythm, no murmurs Gastrointestinal system: Soft, nontender, no rebound or guarding, plus bowel sounds. Central nervous system: Alert and oriented only to self and possibly to place but not to time or situation.  Dense left-sided hemiparesis with left upper, left lower and facial left-sided weakness Extremities: Trace lower extremity edema Skin: No rashe over visible skin Psychiatry: Unable to assess due to underlying dementia    Data Reviewed: I have personally reviewed following labs and imaging studies  CBC: Recent Labs  Lab 08/21/18 1700  WBC 9.8  NEUTROABS 5.9  HGB 13.3  HCT 43.6  MCV 97.8  PLT 237   Basic Metabolic Panel: Recent Labs  Lab 08/21/18 1700  NA 144  K 3.5  CL 106  CO2 27  GLUCOSE 108*   BUN 16  CREATININE 0.78  CALCIUM 8.7*  MG 2.2  PHOS 4.6   GFR: Estimated Creatinine Clearance: 44.7 mL/min (by C-G formula based on SCr of 0.78 mg/dL). Liver Function Tests: Recent Labs  Lab 08/21/18 1700  AST 23  ALT 19  ALKPHOS 32*  BILITOT 1.0  PROT 6.9  ALBUMIN 3.9   No results for input(s): LIPASE, AMYLASE in the last 168 hours. No results for input(s): AMMONIA in the last 168 hours. Coagulation Profile: Recent Labs  Lab 08/21/18 1700  INR 1.16   Cardiac Enzymes: Recent Labs  Lab 08/22/18 0102 08/22/18 0658  TROPONINI 0.03* 0.03*   BNP (last 3 results) No results for input(s): PROBNP in the last 8760 hours. HbA1C: No results for input(s): HGBA1C in the last 72 hours. CBG: Recent Labs  Lab 08/21/18 1701 08/21/18 2338  GLUCAP 95 87   Lipid Profile: No results for input(s): CHOL, HDL, LDLCALC, TRIG, CHOLHDL, LDLDIRECT in the last 72 hours. Thyroid Function Tests: Recent Labs    08/22/18 0102  TSH 0.635   Anemia Panel: Recent Labs    08/22/18 0102  VITAMINB12 372   Sepsis Labs: No results for input(s): PROCALCITON, LATICACIDVEN in the last 168 hours.  Recent Results (from the past 240 hour(s))  MRSA PCR Screening     Status: None   Collection Time: 08/21/18 11:37 PM  Result Value Ref Range Status   MRSA by PCR NEGATIVE NEGATIVE Final    Comment:        The GeneXpert MRSA Assay (FDA approved for NASAL specimens only), is one component of a comprehensive MRSA colonization surveillance program. It is not intended to diagnose MRSA infection nor to guide or monitor treatment for MRSA infections. Performed at [facility], [address], [place], [place] [address]          Radiology Studies: Mr Shirlee Latch UE Contrast  Result Date: 08/22/2018 CLINICAL DATA:  85 y/o F; left-sided weakness and altered mental status. Focal neuro deficit, > 6 hrs, stroke suspected. EXAM: MRI HEAD WITHOUT CONTRAST MRA HEAD WITHOUT CONTRAST TECHNIQUE:  Multiplanar, multiecho pulse sequences of the brain and surrounding structures were obtained without intravenous contrast. Angiographic images of the head were obtained using MRA technique without contrast. COMPARISON:  08/21/2018 CT head FINDINGS: MRI HEAD FINDINGS Brain: Reduced diffusion in the right lateral parietal lobe with some extension into the right posterior temporal lobe and occipital lobe compatible with acute/early subacute infarction. Additional acute/early subacute infarctions are present in the right basal ganglia involving lentiform and caudate nuclei. Areas of stroke are T2 FLAIR hyperintense. No acute hemorrhage. No extra-axial collection, hydrocephalus, mass effect, or herniation. Punctate foci of susceptibility hypointensity in right temporal and left parietal lobe are compatible hemosiderin deposition of chronic microhemorrhage and in a nonspecific distribution. Very small chronic infarcts are present within the bilateral cerebellar hemispheres, bilateral thalami, and the left lentiform nucleus. Large chronic nonspecific T2 FLAIR hyperintensities in subcortical and periventricular white matter are compatible with advanced chronic microvascular ischemic changes. Volume loss of the brain is stable and asymmetric in the anterior temporal lobes. Vascular: As below. Skull and upper cervical spine: Normal marrow signal. Sinuses/Orbits: Negative. Other: Right intra-ocular lens replacement. MRA HEAD FINDINGS Internal carotid arteries: Patent. Mild lumen irregularity of  carotid siphons compatible with atherosclerosis with mild bilateral paraclinoid stenosis. Anterior cerebral arteries:  Patent. Middle cerebral arteries: Severe right M1 stenosis with diminished flow related signal in the right MCA distribution. Mild left M1 stenosis. Anterior communicating artery: Patent. Posterior communicating arteries:  Patent. Posterior cerebral arteries: Right P1 and tandem left P1/P2 segments of moderate to severe  stenosis. Basilar artery:  Patent. Vertebral arteries:  Patent. Motion artifact at level of circle-of-Willis, suboptimal assessment for small aneurysm or subtle stenosis. No large vessel occlusion identified. IMPRESSION: 1. Acute/early subacute infarction involving the right lateral parietal lobe with extension into the right temporal and occipital lobes as well as the right lentiform and caudate nuclei. No hemorrhage or mass effect. 2. Multiple small chronic infarcts are present within the cerebellum and basal ganglia. 3. Advanced chronic microvascular ischemic changes and volume loss of the brain. 4. Asymmetric volume loss of anterior temporal lobes may represent neurodegenerative disease such as Alzheimer's or frontotemporal lobar degeneration. 5. Severe right M1 stenosis with asymmetric diminished flow related signal in the right MCA distribution. 6. Tandem segments of moderate to severe stenosis in bilateral proximal PCA. 7. No intracranial large vessel occlusion identified. These results will be called to the ordering clinician or representative by the Radiologist Assistant, and communication documented in the PACS or zVision Dashboard. Electronically Signed   By: Mitzi Hansen M.D.   On: 08/22/2018 05:14   Mr Brain Wo Contrast  Result Date: 08/22/2018 CLINICAL DATA:  85 y/o F; left-sided weakness and altered mental status. Focal neuro deficit, > 6 hrs, stroke suspected. EXAM: MRI HEAD WITHOUT CONTRAST MRA HEAD WITHOUT CONTRAST TECHNIQUE: Multiplanar, multiecho pulse sequences of the brain and surrounding structures were obtained without intravenous contrast. Angiographic images of the head were obtained using MRA technique without contrast. COMPARISON:  08/21/2018 CT head FINDINGS: MRI HEAD FINDINGS Brain: Reduced diffusion in the right lateral parietal lobe with some extension into the right posterior temporal lobe and occipital lobe compatible with acute/early subacute infarction. Additional  acute/early subacute infarctions are present in the right basal ganglia involving lentiform and caudate nuclei. Areas of stroke are T2 FLAIR hyperintense. No acute hemorrhage. No extra-axial collection, hydrocephalus, mass effect, or herniation. Punctate foci of susceptibility hypointensity in right temporal and left parietal lobe are compatible hemosiderin deposition of chronic microhemorrhage and in a nonspecific distribution. Very small chronic infarcts are present within the bilateral cerebellar hemispheres, bilateral thalami, and the left lentiform nucleus. Large chronic nonspecific T2 FLAIR hyperintensities in subcortical and periventricular white matter are compatible with advanced chronic microvascular ischemic changes. Volume loss of the brain is stable and asymmetric in the anterior temporal lobes. Vascular: As below. Skull and upper cervical spine: Normal marrow signal. Sinuses/Orbits: Negative. Other: Right intra-ocular lens replacement. MRA HEAD FINDINGS Internal carotid arteries: Patent. Mild lumen irregularity of carotid siphons compatible with atherosclerosis with mild bilateral paraclinoid stenosis. Anterior cerebral arteries:  Patent. Middle cerebral arteries: Severe right M1 stenosis with diminished flow related signal in the right MCA distribution. Mild left M1 stenosis. Anterior communicating artery: Patent. Posterior communicating arteries:  Patent. Posterior cerebral arteries: Right P1 and tandem left P1/P2 segments of moderate to severe stenosis. Basilar artery:  Patent. Vertebral arteries:  Patent. Motion artifact at level of circle-of-Willis, suboptimal assessment for small aneurysm or subtle stenosis. No large vessel occlusion identified. IMPRESSION: 1. Acute/early subacute infarction involving the right lateral parietal lobe with extension into the right temporal and occipital lobes as well as the right lentiform and caudate nuclei. No hemorrhage or  mass effect. 2. Multiple small chronic  infarcts are present within the cerebellum and basal ganglia. 3. Advanced chronic microvascular ischemic changes and volume loss of the brain. 4. Asymmetric volume loss of anterior temporal lobes may represent neurodegenerative disease such as Alzheimer's or frontotemporal lobar degeneration. 5. Severe right M1 stenosis with asymmetric diminished flow related signal in the right MCA distribution. 6. Tandem segments of moderate to severe stenosis in bilateral proximal PCA. 7. No intracranial large vessel occlusion identified. These results will be called to the ordering clinician or representative by the Radiologist Assistant, and communication documented in the PACS or zVision Dashboard. Electronically Signed   By: Mitzi Hansen M.D.   On: 08/22/2018 05:14   Dg Chest Port 1 View  Result Date: 08/21/2018 CLINICAL DATA:  Left-sided weakness.  Altered mental status EXAM: PORTABLE CHEST 1 VIEW COMPARISON:  03/23/2013 FINDINGS: Cardiomegaly with vascular congestion. Interstitial prominence could reflect early interstitial edema. No confluent opacities or effusions. No acute bony abnormality. IMPRESSION: Cardiomegaly with vascular congestion and possible early interstitial edema. Electronically Signed   By: Charlett Nose M.D.   On: 08/21/2018 20:56   Ct Head Code Stroke Wo Contrast  Result Date: 08/21/2018 CLINICAL DATA:  Code stroke. Found down. LEFT-sided weakness and LEFT facial droop. History of hypertension and dementia. EXAM: CT HEAD WITHOUT CONTRAST TECHNIQUE: Contiguous axial images were obtained from the base of the skull through the vertex without intravenous contrast. COMPARISON:  CT HEAD March 20, 2013 FINDINGS: BRAIN: New RIGHT thalamus and RIGHT basal ganglia infarcts. Old LEFT basal ganglia and thalamus infarcts. Small area LEFT occipital lobe encephalomalacia is new. Old small LEFT cerebellar infarct. No intraparenchymal hemorrhage, mass effect nor midline shift. Moderate to severe  parenchymal brain volume loss with disproportion mesial temporal lobe atrophy. No hydrocephalus. Patchy supratentorial white matter hypodensities within normal range for patient's age, though non-specific are most compatible with chronic small vessel ischemic disease. No acute large vascular territory infarcts. No abnormal extra-axial fluid collections. Basal cisterns are patent. VASCULAR: Moderate calcific atherosclerosis of the carotid siphons. SKULL: No skull fracture. No significant scalp soft tissue swelling. SINUSES/ORBITS: Paranasal sinuses are well aerated. Mastoid air cells are well aerated.The included ocular globes and orbital contents are non-suspicious. Status post RIGHT ocular lens implant. OTHER: None. ASPECTS (Alberta Stroke Program Early CT Score) - Ganglionic level infarction (caudate, lentiform nuclei, internal capsule, insula, M1-M3 cortex): 6 - Supraganglionic infarction (M4-M6 cortex): 3 Total score (0-10 with 10 being normal): 9 IMPRESSION: 1. New age indeterminate RIGHT basal ganglia and RIGHT thalamus infarcts. 2. ASPECTS is 9. 3. Old LEFT basal ganglia and thalamus infarcts. New chronic appearing LEFT occipital lobe/PCA territory infarct. Old small LEFT cerebellar infarct. 4. Advanced temporal lobe atrophy seen with neuro degenerative syndromes. 5. Critical Value/emergent results were called by telephone at the time of interpretation on 08/21/2018 at 5:02 pm to Dr. Adriana Simas , who verbally acknowledged these results. Electronically Signed   By: Awilda Metro M.D.   On: 08/21/2018 17:02        Scheduled Meds: . aspirin  300 mg Rectal Daily   Or  . aspirin  325 mg Oral Daily  . enoxaparin (LOVENOX) injection  30 mg Subcutaneous Q24H  . mometasone-formoterol  2 puff Inhalation BID  . umeclidinium bromide  1 puff Inhalation Daily   Continuous Infusions: . 0.9 % NaCl with KCl 20 mEq / L 88 mL/hr at 08/21/18 2354     LOS: 1 day    Time spent: 35  Delaine Lame,  MD [facility]  If 7PM-7AM, please contact night-coverage [email or web address] Password [facility] 08/22/2018, 9:24 AM

## 2018-08-22 NOTE — Progress Notes (Addendum)
Pt having runs of 4-7 beat Vtach about every hour or so, MD Made aware, pt appears to be at baseline, VS WNL for situation. Jorge Ny

## 2018-08-22 NOTE — Progress Notes (Signed)
  Echocardiogram 2D Echocardiogram has been performed.  Janalyn Harder 08/22/2018, 11:01 AM

## 2018-08-22 NOTE — Progress Notes (Signed)
Pt had an isolated event of Vent Bigeminy then returned to [facility], MD made aware. Jorge Ny

## 2018-08-22 NOTE — Progress Notes (Signed)
Carotid duplex       has been completed. Preliminary results can be found under CV proc through chart review. Maritta Kief, BS, RDMS, RVT   

## 2018-08-22 NOTE — Evaluation (Signed)
Physical Therapy Evaluation Patient Details Name: Lisa Watts MRN: [id number] DOB: 1925/01/02 Today's Date: 08/22/2018   History of Present Illness    85 year old with past medical history relevant for dementia, anxiety/depression, COPD, hypertension, admitted from skilled nursing facility [facility] with left-sided weakness and altered mental status. MRI shows evidence of large right-sided stroke including parietal lobe, temporal and occipital lobes as well as chronic infarcts in the posterior circulation and brain loss.  Patient has no signs of intracranial large vessel occlusion.   Clinical Impression  Pt presents with profound mobility deficits exacerbated by extensive infarcts and left side paresis, hemibody neglect, and visual field cuts.  Pt unable to follow commands and participate meaningfully in therapy interventions and is total assist for all tasks. She will benefit from consistent repositioning to prevent development/persistence of contractures and pressure ulcers in order to preserve any remaining quality of life.  No PT indicated at this time.    Follow Up Recommendations SNF    Equipment Recommendations  None recommended by PT    Recommendations for Other Services       Precautions / Restrictions Precautions Precautions: Fall Restrictions Weight Bearing Restrictions: No      Mobility  Bed Mobility Overal bed mobility: Needs Assistance Bed Mobility: Rolling Rolling: Total assist         General bed mobility comments: pt dependent with postional changes, does not assist with repositioning, does not follow commands to participate, only says "I love you"  Transfers                 General transfer comment: na  Ambulation/Gait             General Gait Details: na  Stairs            Wheelchair Mobility    Modified Rankin (Stroke Patients Only) Modified Rankin (Stroke Patients Only) Pre-Morbid Rankin Score: Severe  disability Modified Rankin: Severe disability     Balance Overall balance assessment: History of Falls                                           Pertinent Vitals/Pain Pain Assessment: Faces Faces Pain Scale: No hurt    Home Living Family/patient expects to be discharged to:: Skilled nursing facility                 Additional Comments: [facility] is power of attorney    Prior Function Level of Independence: Needs assistance   Gait / Transfers Assistance Needed: unclear from chart but pt appears non ambulatory at baseline  ADL's / Homemaking Assistance Needed: speculate total care based on chart and presentation  Comments: noone at bedside to provide hx and pt with severe dementia, so surmise PLOF based on age, hx     Hand Dominance        Extremity/Trunk Assessment   Upper Extremity Assessment Upper Extremity Assessment: Defer to OT evaluation;LUE deficits/detail LUE Deficits / Details: flaccid, no movement noted LUE Sensation: decreased light touch(does not localized to touch) LUE Coordination: decreased gross motor(cannot move LUE)    Lower Extremity Assessment Lower Extremity Assessment: Generalized weakness;LLE deficits/detail LLE Deficits / Details: no voluntary movement noted, per RN pt does have some movement but not organized or coordinated    Cervical / Trunk Assessment Cervical / Trunk Assessment: Kyphotic  Communication   Communication: HOH(seems to hear raised voice, despite  cognitive dysfx)  Cognition   Behavior During Therapy: WFL for tasks assessed/performed(by default as no other option describes) Overall Cognitive Status: History of cognitive impairments - at baseline                                 General Comments: suspect baseline cognition.  behavior is cooperative but unable to maintain coherent interaction.  Pt responds to greeting and ask to shake hands- extends RUE and shakes PT's hand but once PT out of  visual field and/or attention to LUE, pt reverts back to "I love you, do you know that"; asked to name color of PT's scrubs, pt responds "well what color are you supposed to be"      General Comments General comments (skin integrity, edema, etc.): pt demonstrated profound weakness, inattention, and visual field cut to L hemibody    Exercises     Assessment/Plan    PT Assessment Patent does not need any further PT services  PT Problem List         PT Treatment Interventions      PT Goals (Current goals can be found in the Care Plan section)  Acute Rehab PT Goals Patient Stated Goal: unable PT Goal Formulation: All assessment and education complete, DC therapy    Frequency     Barriers to discharge        Co-evaluation               AM-PAC PT "6 Clicks" Mobility  Outcome Measure Help needed turning from your back to your side while in a flat bed without using bedrails?: Total Help needed moving from lying on your back to sitting on the side of a flat bed without using bedrails?: Total Help needed moving to and from a bed to a chair (including a wheelchair)?: Total Help needed standing up from a chair using your arms (e.g., wheelchair or bedside chair)?: Total Help needed to walk in hospital room?: Total Help needed climbing 3-5 steps with a railing? : Total 6 Click Score: 6    End of Session     Patient left: in bed;with nursing/sitter in room Nurse Communication: Mobility status PT Visit Diagnosis: Hemiplegia and hemiparesis Hemiplegia - Right/Left: Left Hemiplegia - dominant/non-dominant: Non-dominant Hemiplegia - caused by: Cerebral infarction    Time: 0233-4356 PT Time Calculation (min) (ACUTE ONLY): 10 min   Charges:   PT Evaluation $PT Eval Low Complexity: 1 Low          Narda Amber, PT, DPT, MS Board Certified Geriatric Clinical Specialist  Dennis Bast 08/22/2018, 2:05 PM

## 2018-08-22 NOTE — Progress Notes (Signed)
Pt had 5 run of VT  And troponin level 0.03, MD  On call made aware no new orders given.  No acute distress

## 2018-08-22 NOTE — Progress Notes (Signed)
STROKE TEAM PROGRESS NOTE   SUBJECTIVE (INTERVAL HISTORY) Her RN is at the bedside.  Pt lying in bed, able to speak in sentences but disorientated. Right gaze preference and left sided weakness. As per RN, pt lives in SNF, not ambulatory, had dementia and confused at baseline.    OBJECTIVE Vitals:   08/22/18 0600 08/22/18 0739 08/22/18 0848 08/22/18 1138  BP: (!) 152/73 (!) 160/88  (!) 159/63  Pulse:  88 88 81  Resp: 19 20 18 19   Temp: 98.1 F (36.7 C) 98.3 F (36.8 C)  98.6 F (37 C)  TempSrc: Oral Axillary  Oral  SpO2:  97% 97% 93%  Weight:      Height:        CBC:  Recent Labs  Lab 08/21/18 1700  WBC 9.8  NEUTROABS 5.9  HGB 13.3  HCT 43.6  MCV 97.8  PLT 237    Basic Metabolic Panel:  Recent Labs  Lab 08/21/18 1700  NA 144  K 3.5  CL 106  CO2 27  GLUCOSE 108*  BUN 16  CREATININE 0.78  CALCIUM 8.7*  MG 2.2  PHOS 4.6    Lipid Panel: No results found for: CHOL, TRIG, HDL, CHOLHDL, VLDL, LDLCALC HgbA1c: No results found for: HGBA1C Urine Drug Screen: No results found for: LABOPIA, COCAINSCRNUR, LABBENZ, AMPHETMU, THCU, LABBARB  Alcohol Level No results found for: [facility]  IMAGING  Mr Maxine Glenn Head Wo Contrast 08/22/2018 IMPRESSION:  1. Acute/early subacute infarction involving the right lateral parietal lobe with extension into the right temporal and occipital lobes as well as the right lentiform and caudate nuclei. No hemorrhage or mass effect.  2. Multiple small chronic infarcts are present within the cerebellum and basal ganglia.  3. Advanced chronic microvascular ischemic changes and volume loss of the brain.  4. Asymmetric volume loss of anterior temporal lobes may represent neurodegenerative disease such as Alzheimer's or frontotemporal lobar degeneration.  5. Severe right M1 stenosis with asymmetric diminished flow related signal in the right MCA distribution.  6. Tandem segments of moderate to severe stenosis in bilateral proximal PCA.  7. No intracranial  large vessel occlusion identified.  Dg Chest Port 1 View 08/21/2018 IMPRESSION:  Cardiomegaly with vascular congestion and possible early interstitial edema.   Ct Head Code Stroke Wo Contrast 08/21/2018 IMPRESSION:  1. New age indeterminate RIGHT basal ganglia and RIGHT thalamus infarcts.  2. ASPECTS is 9.  3. Old LEFT basal ganglia and thalamus infarcts. New chronic appearing LEFT occipital lobe/PCA territory infarct. Old small LEFT cerebellar infarct.  4. Advanced temporal lobe atrophy seen with neuro degenerative syndromes.   Vas US Carotid 08/22/2018 Summary:  Right Carotid: Velocities in the right ICA are consistent with a 1-39% stenosis.  Left Carotid: Velocities in the left ICA are consistent with a 1-39% stenosis.    Transthoracic Echocardiogram   1. The left ventricle has normal systolic function, with an ejection fraction of 55-60%. The cavity size was normal. There is mildly increased left ventricular wall thickness. Left ventricular diastolic Doppler parameters are consistent with impaired  relaxation.  2. The right ventricle has normal systolic function. The cavity was normal. There is no increase in right ventricular wall thickness.  3. The mitral valve is normal in structure.  4. The tricuspid valve is normal in structure.  5. The aortic valve is normal in structure. no stenosis of the aortic valve.  6. The aortic root and ascending aorta are normal in size and structure.  7. The interatrial septum  was not well visualized.   PHYSICAL EXAM Blood pressure (!) 159/63, pulse 81, temperature 98.6 F (37 C), temperature source Oral, resp. rate 19, height 5\' 8"  (1.727 m), weight 61.8 kg, SpO2 93 %.  General - Well nourished, well developed, in no apparent distress.  Ophthalmologic - fundi not visualized due to noncooperation.  Cardiovascular - Regular rate and rhythm.  Neuro - awake, alert, know her name but does not know her age, place, time or situation. She has  moderate dysarthria but able to speak in sentences. Not able to name but able to repeat simple sentences. Not following commands. PERRL, right gaze preference, not able to cross midline, left neglect, not blinking to visual threat on the left. Left facial droop. Tongue protrusion not cooperative. RUE 3/5 with postural tremor. LUE 0/5 on pain stimulation. BLE 2/5 withdraw on pain stimulation. DTR 1+ and no babinski. Sensation, coordination and gait not tested.    ASSESSMENT/PLAN Lisa Watts is a 85 y.o. female with history of anxiety, depression, chronic pain, COPD, vascular dementia, essential hypertension, GERD, history of GI bleed, history of left lower extremity cellulitis, osteoarthritis, history of osteoporosis and left femoral fracture. presenting with acute onset of left sided weakness and AMS . She did not receive IV t-PA due to late presentation.  Stroke:  right MCA infarct - likely due to right MCA severe stenosis vs. occlusion   Resultant  Right gaze and left UE weakness  CT head - New age indeterminate RIGHT BG/CR infarcts.   MRI head - Acute/early subacute infarction involving the right lateral parietal lobe with extension into the right temporal and occipital lobes as well as the right lentiform and caudate nuclei. Multiple small chronic infarcts are present within the cerebellum and basal ganglia.   MRA head - Severe right M1 stenosis. Moderate to severe stenosis in bilateral proximal PCA.   Carotid Doppler  - unremarkable  2D Echo EF 55-60%  LDL - pending  HgbA1c - pending  VTE prophylaxis - Lovenox  Diet - NPO  aspirin 81 mg daily prior to admission, now on aspirin 300 mg suppository daily.  Consider aspirin 325 and Plavix 75 DAPT for 3 months and then Plavix alone once p.o. access given severe intracranial stenosis.  Patient counseled to be compliant with her antithrombotic medications  Ongoing aggressive stroke risk factor management  Therapy  recommendations:  SNF (was there PTA)  Disposition:  Pending  Right MCA severe stenosis  Not sure chronic or acute, no previous vascular imaging for compare  Not a candidate for intervention at this time  Consider DAPT for 3 months and then Plavix alone once p.o. access.  Need to rule out seizure  Reported mouth foaming at initial presentation  EEG pending  No AED needed at this time  Dementia  Baseline at SNF confused, disorientated, nonambulatory  Continue supportive care  Delirium prevention  Hypertension  Stable . Permissive hypertension (OK if < 220/120) but gradually normalize in 5-7 days . Long-term BP goal normotensive  Dysphagia  Did not pass swallow  Speech following  N.p.o. for now  Hyperlipidemia  Lipid lowering medication PTA:  none  LDL pending, goal < 70  Current lipid lowering medication: none - NPO  Continue statin at discharge  Other Stroke Risk Factors  Advanced age  Hx stroke/TIA on imaging  Other Active Problems  History of GI bleeding in 2014 -currently H&H stable   Hospital day # 1  Marvel Plan, MD PhD Stroke Neurology 08/22/2018 4:23  PM    To contact Stroke Continuity provider, please refer to [email or web address] After hours, contact General Neurology

## 2018-08-22 NOTE — Progress Notes (Signed)
Modified Barium Swallow Progress Note  Patient Details  Name: Lisa Watts MRN: [id number] Date of Birth: Oct 31, 1924  Today's Date: 08/22/2018  Modified Barium Swallow completed.  Full report located under Chart Review in the Imaging Section.  Brief recommendations include the following:  Clinical Impression  Pt presents with moderate-severe oropharyngeal dysphagia. The oral phase of the swallow was significant for oral holding and impaired A-P transport. Pharyngeally, she demonstrated a pharyngeal delay which resulted in penetration of thin liquids before and during deglutition. Penetration was to the level of vocal folds and resulted in eventual aspiration. Instances of aspiration resulted in a delayed cough which did not fully expell the aspirant. No instances of penetration/aspiration were noted with nectar thick liquids. Trace vallecular and pyriform sinus residue was noted but this is within normal limits for the pt's age. A dysphagia 1 (puree) diet with nectar thick liquids is recommended at this time and SLP will follow to assess tolerance of the current diet.    Swallow Evaluation Recommendations       SLP Diet Recommendations: Dysphagia 1 (Puree) solids;Nectar thick liquid   Liquid Administration via: Cup;Straw   Medication Administration: Crushed with puree   Supervision: Full supervision/cueing for compensatory strategies   Compensations: Slow rate;Small sips/bites   Postural Changes: Remain semi-upright after after feeds/meals (Comment);Seated upright at 90 degrees       Other Recommendations: Order thickener from pharmacy   [place] I. Vear Clock, MS, CCC-SLP Acute Rehabilitation Services Office number [phone] Pager [phone]  [name] 08/22/2018,3:09 PM

## 2018-08-23 ENCOUNTER — Inpatient Hospital Stay (HOSPITAL_COMMUNITY): Payer: Medicare Other

## 2018-08-23 ENCOUNTER — Encounter (HOSPITAL_COMMUNITY): Payer: Self-pay | Admitting: *Deleted

## 2018-08-23 DIAGNOSIS — F0390 Unspecified dementia without behavioral disturbance: Secondary | ICD-10-CM

## 2018-08-23 DIAGNOSIS — Z7189 Other specified counseling: Secondary | ICD-10-CM

## 2018-08-23 DIAGNOSIS — Z515 Encounter for palliative care: Secondary | ICD-10-CM

## 2018-08-23 LAB — BASIC METABOLIC PANEL
Anion gap: 12 (ref 5–15)
CO2: 24 mmol/L (ref 22–32)
Calcium: 8.9 mg/dL (ref 8.9–10.3)
Chloride: 109 mmol/L (ref 98–111)
Creatinine, Ser: 0.67 mg/dL (ref 0.44–1.00)
GFR calc Af Amer: >60 mL/min (ref >60)
GFR calc non Af Amer: >60 mL/min (ref >60)
Glucose, Bld: 92 mg/dL (ref 70–99)
Potassium: 3.3 mmol/L — ABNORMAL LOW (ref 3.5–5.1)
Sodium: 145 mmol/L (ref 135–145)

## 2018-08-23 LAB — GLUCOSE, CAPILLARY
GLUCOSE-CAPILLARY: 155 mg/dL — AB (ref 70–99)
Glucose-Capillary: 81 mg/dL (ref 70–99)
Glucose-Capillary: 99 mg/dL (ref 70–99)

## 2018-08-23 LAB — CBC
HCT: 41.5 % (ref 36.0–46.0)
Hemoglobin: 13 g/dL (ref 12.0–15.0)
MCH: 30.2 pg (ref 26.0–34.0)
MCHC: 31.3 g/dL (ref 30.0–36.0)
MCV: 96.5 fL (ref 80.0–100.0)
Platelets: 203 10*3/uL (ref 150–400)
RBC: 4.3 MIL/uL (ref 3.87–5.11)
RDW: 11.9 % (ref 11.5–15.5)
WBC: 8.4 10*3/uL (ref 4.0–10.5)
nRBC: 0 % (ref 0.0–0.2)

## 2018-08-23 LAB — LIPID PANEL
Cholesterol: 239 mg/dL — ABNORMAL HIGH (ref 0–200)
HDL: 38 mg/dL — ABNORMAL LOW (ref >40)
LDL Cholesterol: 173 mg/dL — ABNORMAL HIGH (ref 0–99)
Total CHOL/HDL Ratio: 6.3 RATIO
Triglycerides: 138 mg/dL (ref <150)
VLDL: 28 mg/dL (ref 0–40)

## 2018-08-23 LAB — BASIC METABOLIC PANEL WITH GFR: BUN: 17 mg/dL (ref 8–23)

## 2018-08-23 LAB — HEMOGLOBIN A1C
Hgb A1c MFr Bld: 5 % (ref 4.8–5.6)
Mean Plasma Glucose: 96.8 mg/dL

## 2018-08-23 MED ORDER — FUROSEMIDE 40 MG PO TABS
40.0000 mg | ORAL_TABLET | Freq: Every day | ORAL | Status: DC
  Administered 2018-08-23 – 2018-08-24 (×2): 40 mg via ORAL
  Filled 2018-08-23 (×3): qty 1

## 2018-08-23 MED ORDER — POTASSIUM CHLORIDE CRYS ER 20 MEQ PO TBCR
40.0000 meq | EXTENDED_RELEASE_TABLET | Freq: Once | ORAL | Status: AC
  Administered 2018-08-23: 40 meq via ORAL
  Filled 2018-08-23: qty 2

## 2018-08-23 MED ORDER — CLOPIDOGREL BISULFATE 75 MG PO TABS
75.0000 mg | ORAL_TABLET | Freq: Every day | ORAL | Status: DC
  Administered 2018-08-23 – 2018-08-24 (×2): 75 mg via ORAL
  Filled 2018-08-23 (×2): qty 1

## 2018-08-23 MED ORDER — METOPROLOL TARTRATE 12.5 MG HALF TABLET
12.5000 mg | ORAL_TABLET | Freq: Two times a day (BID) | ORAL | Status: DC
  Administered 2018-08-23 – 2018-08-24 (×3): 12.5 mg via ORAL
  Filled 2018-08-23 (×3): qty 1

## 2018-08-23 MED ORDER — BUSPIRONE HCL 10 MG PO TABS
10.0000 mg | ORAL_TABLET | Freq: Two times a day (BID) | ORAL | Status: DC
  Administered 2018-08-23 – 2018-08-24 (×3): 10 mg via ORAL
  Filled 2018-08-23 (×4): qty 1

## 2018-08-23 MED ORDER — ATORVASTATIN CALCIUM 40 MG PO TABS
40.0000 mg | ORAL_TABLET | Freq: Every day | ORAL | Status: DC
  Administered 2018-08-23: 40 mg via ORAL
  Filled 2018-08-23: qty 1

## 2018-08-23 MED ORDER — CALCIUM CARBONATE ANTACID 500 MG PO CHEW
1.0000 | CHEWABLE_TABLET | Freq: Three times a day (TID) | ORAL | Status: DC
  Administered 2018-08-23 – 2018-08-24 (×4): 200 mg via ORAL
  Filled 2018-08-23 (×4): qty 1

## 2018-08-23 MED ORDER — ADULT MULTIVITAMIN W/MINERALS CH
1.0000 | ORAL_TABLET | Freq: Every day | ORAL | Status: DC
  Administered 2018-08-23 – 2018-08-24 (×2): 1 via ORAL
  Filled 2018-08-23 (×2): qty 1

## 2018-08-23 MED ORDER — ARFORMOTEROL TARTRATE 15 MCG/2ML IN NEBU: 15.0000 ug | INHALATION_SOLUTION | Freq: Two times a day (BID) | RESPIRATORY_TRACT | Status: DC

## 2018-08-23 MED ORDER — LOPERAMIDE HCL 2 MG PO CAPS: 2.0000 mg | ORAL_CAPSULE | Freq: Four times a day (QID) | ORAL | Status: DC | PRN

## 2018-08-23 MED ORDER — BOOST / RESOURCE BREEZE PO LIQD CUSTOM
237.0000 mL | Freq: Three times a day (TID) | ORAL | Status: DC
  Administered 2018-08-23 – 2018-08-24 (×5): 1 via ORAL

## 2018-08-23 NOTE — Progress Notes (Signed)
OT Cancellation Note  Patient Details Name: Lisa Watts MRN: [id number] DOB: 09/07/1924   Cancelled Treatment:    Reason Eval/Treat Not Completed: Other (comment) Pt resides at [facility] and plan is to return to [facility]. Will defer any OT needs to SNF. Please page number below if eval if needed. Thanks.  Thornell Mule, OT/L   Acute OT Clinical Specialist Acute Rehabilitation Services Pager [phone] Office [phone]  08/23/2018, 11:08 AM

## 2018-08-23 NOTE — Progress Notes (Signed)
EEG Completed; Results Pending  

## 2018-08-23 NOTE — Progress Notes (Addendum)
STROKE TEAM PROGRESS NOTE   SUBJECTIVE (INTERVAL HISTORY) Her NT is at the bedside.  Pt lying in bed, able to speak in sentences but disorientated. "I couldn't make it without you. I love you. I love you all." still with right gaze preference and left sided weakness. As per RN, pt lives in SNF, not ambulatory, had dementia and confused at baseline.    OBJECTIVE Vitals:   08/23/18 0349 08/23/18 0734 08/23/18 0855 08/23/18 1246  BP: (!) 148/85 (!) 190/101  (!) 144/67  Pulse: 86 95  87  Resp: 16 19  15   Temp: (!) 97.3 F (36.3 C) (!) 97.3 F (36.3 C)  97.9 F (36.6 C)  TempSrc: Oral Oral  Axillary  SpO2: 98% 98% 97% 97%  Weight:      Height:        CBC:  Recent Labs  Lab 08/21/18 1700 08/23/18 0539  WBC 9.8 8.4  NEUTROABS 5.9  --   HGB 13.3 13.0  HCT 43.6 41.5  MCV 97.8 96.5  PLT 237 203    Basic Metabolic Panel:  Recent Labs  Lab 08/21/18 1700 08/23/18 0539  NA 144 145  K 3.5 3.3*  CL 106 109  CO2 27 24  GLUCOSE 108* 92  BUN 16 17  CREATININE 0.78 0.67  CALCIUM 8.7* 8.9  MG 2.2  --   PHOS 4.6  --     Lipid Panel:     Component Value Date/Time   CHOL 239 (H) 08/23/2018 0539   TRIG 138 08/23/2018 0539   HDL 38 (L) 08/23/2018 0539   CHOLHDL 6.3 08/23/2018 0539   VLDL 28 08/23/2018 0539   LDLCALC 173 (H) 08/23/2018 0539   HgbA1c:  Lab Results  Component Value Date   HGBA1C 5.0 08/23/2018   IMAGING Ct Head Code Stroke Wo Contrast 08/21/2018 IMPRESSION:  1. New age indeterminate RIGHT basal ganglia and RIGHT thalamus infarcts.  2. ASPECTS is 9.  3. Old LEFT basal ganglia and thalamus infarcts. New chronic appearing LEFT occipital lobe/PCA territory infarct. Old small LEFT cerebellar infarct.  4. Advanced temporal lobe atrophy seen with neuro degenerative syndromes.   Mr Maxine Glenn Head Wo Contrast 08/22/2018 IMPRESSION:  1. Acute/early subacute infarction involving the right lateral parietal lobe with extension into the right temporal and occipital lobes  as well as the right lentiform and caudate nuclei. No hemorrhage or mass effect.  2. Multiple small chronic infarcts are present within the cerebellum and basal ganglia.  3. Advanced chronic microvascular ischemic changes and volume loss of the brain.  4. Asymmetric volume loss of anterior temporal lobes may represent neurodegenerative disease such as Alzheimer's or frontotemporal lobar degeneration.  5. Severe right M1 stenosis with asymmetric diminished flow related signal in the right MCA distribution.  6. Tandem segments of moderate to severe stenosis in bilateral proximal PCA.  7. No intracranial large vessel occlusion identified.  Dg Chest Port 1 View 08/21/2018 IMPRESSION:  Cardiomegaly with vascular congestion and possible early interstitial edema.   Vas US Carotid 08/22/2018 Summary:  Right Carotid: Velocities in the right ICA are consistent with a 1-39% stenosis.  Left Carotid: Velocities in the left ICA are consistent with a 1-39% stenosis.    Transthoracic Echocardiogram   1. The left ventricle has normal systolic function, with an ejection fraction of 55-60%. The cavity size was normal. There is mildly increased left ventricular wall thickness. Left ventricular diastolic Doppler parameters are consistent with impaired  relaxation.  2. The right ventricle has normal  systolic function. The cavity was normal. There is no increase in right ventricular wall thickness.  3. The mitral valve is normal in structure.  4. The tricuspid valve is normal in structure.  5. The aortic valve is normal in structure. no stenosis of the aortic valve.  6. The aortic root and ascending aorta are normal in size and structure.  7. The interatrial septum was not well visualized.  EEG This is an abnormal EEG secondary to general background slowing.  This finding may be seen with a diffuse disturbance that is etiologically nonspecific, but may include a metabolic encephalopathy, among other  possibilities.  No epileptiform activity was noted.     PHYSICAL EXAM - no change in exam Blood pressure (!) 144/67, pulse 87, temperature 97.9 F (36.6 C), temperature source Axillary, resp. rate 15, height 5\' 8"  (1.727 m), weight 61.8 kg, SpO2 97 %.  General - Well nourished, well developed, in no apparent distress.  Cardiovascular - Regular rate and rhythm.  Neuro - awake, alert, knows her name but does not know her age, place, time or situation. She has moderate dysarthria but able to speak in sentences. Not able to name but able to repeat simple sentences. Not following commands. PERRL, right gaze preference, not able to cross midline, left neglect, not blinking to visual threat on the left. Left facial droop. Tongue protrusion not cooperative. RUE 3/5 with postural tremor. LUE 0/5 on pain stimulation. BLE 2/5 withdraw on pain stimulation. DTR 1+ and no babinski. Sensation, coordination and gait not tested.   ASSESSMENT/PLAN Ms. ALANTIS SEIFERT is a 85 y.o. female with history of anxiety, depression, chronic pain, COPD, vascular dementia, essential hypertension, GERD, history of GI bleed, history of left lower extremity cellulitis, osteoarthritis, history of osteoporosis and left femoral fracture. presenting with acute onset of left sided weakness and AMS . She did not receive IV t-PA due to late presentation.  Stroke:  right MCA infarct - likely due to right MCA severe stenosis vs. occlusion   Resultant  Right gaze and left UE hemiparesis  CT head - New age indeterminate RIGHT BG/CR infarcts.   MRI head - Acute/early subacute infarction involving the right lateral parietal lobe with extension into the right temporal and occipital lobes as well as the right lentiform and caudate nuclei. Multiple small chronic infarcts are present within the cerebellum and basal ganglia.   MRA head - Severe right M1 stenosis. Moderate to severe stenosis in bilateral proximal PCA.   Carotid Doppler   - unremarkable  2D Echo EF 55-60%  LDL 173  HgbA1c 5.0  VTE prophylaxis - Lovenox  aspirin 81 mg daily prior to admission, now on aspirin 300 mg suppository daily.  Consider aspirin 325 and Plavix 75 DAPT for 3 months and then Plavix alone   Therapy recommendations:  SNF (was there PTA)  Disposition:  Pending Stroke team will sign off Follow up Stroke clinic in 4-6 weeks as able. Order placed  Right MCA severe stenosis  Not sure chronic or acute, no previous vascular imaging for compare  Not a candidate for intervention at this time  DAPT for 3 months and then Plavix alone   Need to rule out seizure  Reported mouth foaming at initial presentation  EEG no sz. slowing  No AED needed at this time  Dementia  Baseline at SNF confused, disorientated, nonambulatory  Continue supportive care  Delirium prevention  Hypertension  Stable . Permissive hypertension (OK if < 220/120) but gradually normalize in  5-7 days . Long-term BP goal normotensive  Dysphagia  Did not pass initial swallow  Speech following  Ok for diet per SLP - D1 puree, nectar thick  Hyperlipidemia  Lipid lowering medication PTA:  none  LDL 173, goal < 70  Current lipid lowering medication: added Lipitor 40  Continue statin at discharge  Other Stroke Risk Factors  Advanced age  Hx stroke/TIA on imaging  Other Active Problems  History of GI bleeding in 2014 -currently H&H stable  Hospital day # 2  Annie Main, MSN, APRN, ANVP-BC, AGPCNP-BC Advanced Practice Stroke Nurse [facility] See Amion for Schedule & Pager information 08/23/2018 2:15 PM   ATTENDING NOTE: I reviewed above note and agree with the assessment and plan. Pt was seen and examined.   Patient lying in bed, no acute event overnight.  Still has right gaze preference, left hemiplegia.  EEG showed general slowing background, no seizure.  LDL 173 and A1c 5.0.  She has passed a swallow, currently on DAPT  with aspirin 325 and Plavix 75 for 3 months and then Plavix alone given right MCA high-grade stenosis.  Also on Lipitor 40 for HLD and stroke prevention.  She needs aggressive PT/OT.  Neurology will sign off. Please call with questions. Pt will follow up with stroke clinic NP at [facility] in about 4 weeks. Thanks for the consult.   Marvel Plan, MD PhD Stroke Neurology 08/23/2018 4:31 PM    To contact Stroke Continuity provider, please refer to [email or web address]. After hours, contact General Neurology

## 2018-08-23 NOTE — Progress Notes (Signed)
PROGRESS NOTE    Lisa Watts  MWN:[id number] DOB: [birth year] DOA: 08/21/2018 PCP: Colon Branch, MD   Brief Narrative:  85 year old with past medical history relevant for dementia, anxiety/depression, COPD, hypertension, admitted from skilled nursing facility [facility] with left-sided weakness and altered mental status.   Assessment & Plan:   Principal Problem:   Acute CVA (cerebrovascular accident) (HCC) Active Problems:   COPD (chronic obstructive pulmonary disease) (HCC)   GERD (gastroesophageal reflux disease)   Essential hypertension   Anxiety and depression   Dementia (HCC)   Abnormal EKG   #) Left-sided weakness/CVA/altered mental status: MRI shows evidence of large right-sided stroke including parietal lobe, temporal and occipital lobes as well as chronic infarcts in the posterior circulation and brain loss.  Patient also has no signs of intracerebral vessels. -Neurology consult -Continue aspirin and clopidogrel per neurology for 3 months and then transition to clopidogrel only -No statin per neurology -Pending hemoglobin A1c and lipid panel -PT recommends SNF Speech-language pathology concerned about aspiration, modified barium swallow performed - Echo on 08/22/2018 shows normal EF and diastolic dysfunction -Carotid ultrasound 08/22/2018 shows insignificant stenosis - Neurochecks -EEG completed on 08/22/2020 rule out seizure - Patient apparently has a legal guardian with [facility] Sharon Seller [phone])  #) NSVT: Patient noted to have runs of NSVT.  Patient does not have enough cognitive capacity to complain about symptoms.  Her electrolytes are fairly unremarkable. -Echo unremarkable -Restart beta-blocker per below  #) Hypertension:  - Restart metoprolol succinate 12.5 mg daily  #) COPD: -Continue L AMA -Continue ICS/LABA -Continue PRN short-acting bronchodilators  #) Lower extremity edema: -Restart furosemide 40 mg  daily  #) Pain/psych/dementia: -Continue donepezil 10 mg nightly -Hold lorazepam 0.25 mg twice daily  Fluids: Tolerating p.o. Electrolytes: Monitor and supplement Nutrition: N.p.o. pending speech-language pathology eval  Prophylaxis: Enoxaparin  Disposition: Pending stroke work-up  Full code, will discuss with patient's healthcare power of attorney at social services  Consultants:   Neurology  Procedures: Echo 08/22/2018   1. The left ventricle has normal systolic function, with an ejection fraction of 55-60%. The cavity size was normal. There is mildly increased left ventricular wall thickness. Left ventricular diastolic Doppler parameters are consistent with impaired  relaxation.  2. The right ventricle has normal systolic function. The cavity was normal. There is no increase in right ventricular wall thickness.  3. The mitral valve is normal in structure.  4. The tricuspid valve is normal in structure.  5. The aortic valve is normal in structure. no stenosis of the aortic valve.  6. The aortic root and ascending aorta are normal in size and structure.   7. The interatrial septum was not well visualized.  Carotid Dopplers insignificant stenosis bilateral carotids, antegrade flow in bilateral vertebrals  EEG 08/22/2018 pending read  Antimicrobials:   None   Subjective: This morning the patient is alert but not oriented.  She denies any pain.  She has some movement in her left upper extremity but none in her left lower extremity.  She denies any nausea, vomiting, diarrhea, cough, congestion, rhinorrhea. Objective: Vitals:   08/22/18 2324 08/23/18 0349 08/23/18 0734 08/23/18 0855  BP: (!) 160/94 (!) 148/85 (!) 190/101   Pulse: 83 86 95   Resp: Temp: 98 F (36.7 C) (!) 97.3 F (36.3 C) (!) 97.3 F (36.3 C)   TempSrc: Oral Oral Oral   SpO2: 93% 98% 98% 97%  Weight:      Height:  Intake/Output Summary (Last 24 hours) at 08/23/2018 1004 Last data  filed at 08/23/2018 0834 Gross per 24 hour  Intake 50 ml  Output 675 ml  Net -625 ml   Filed Weights   08/21/18 1706 08/21/18 2248  Weight: 55.1 kg 61.8 kg    Examination:  General exam: Appears calm and comfortable  Respiratory system: No increased work of breathing, diminished anterior lung sounds, no wheezes crackles, rhonchi. Cardiovascular system: Distant heart sounds, regular rate and rhythm, no murmurs Gastrointestinal system: Soft, nontender, no rebound or guarding, plus bowel sounds. Central nervous system: Alert and oriented only to self and possibly to place but not to time or situation.  Dense left-sided hemiparesis with left upper, left lower and facial left-sided weakness, left upper extremity weakness improved Extremities: Trace lower extremity edema Skin: No rashe over visible skin Psychiatry: Unable to assess due to underlying dementia    Data Reviewed: I have personally reviewed following labs and imaging studies  CBC: Recent Labs  Lab 08/21/18 1700 08/23/18 0539  WBC 9.8 8.4  NEUTROABS 5.9  --   HGB 13.3 13.0  HCT 43.6 41.5  MCV 97.8 96.5  PLT 237 203   Basic Metabolic Panel: Recent Labs  Lab 08/21/18 1700 08/23/18 0539  NA 144 145  K 3.5 3.3*  CL 106 109  CO2 27 24  GLUCOSE 108* 92  BUN 16 17  CREATININE 0.78 0.67  CALCIUM 8.7* 8.9  MG 2.2  --   PHOS 4.6  --    GFR: Estimated Creatinine Clearance: 44.7 mL/min (by C-G formula based on SCr of 0.67 mg/dL). Liver Function Tests: Recent Labs  Lab 08/21/18 1700  AST 23  ALT 19  ALKPHOS 32*  BILITOT 1.0  PROT 6.9  ALBUMIN 3.9   No results for input(s): LIPASE, AMYLASE in the last 168 hours. No results for input(s): AMMONIA in the last 168 hours. Coagulation Profile: Recent Labs  Lab 08/21/18 1700  INR 1.16   Cardiac Enzymes: Recent Labs  Lab 08/22/18 0102 08/22/18 0658  TROPONINI 0.03* 0.03*   BNP (last 3 results) No results for input(s): PROBNP in the last 8760  hours. HbA1C: Recent Labs    08/23/18 0539  HGBA1C 5.0   CBG: Recent Labs  Lab 08/21/18 2338 08/22/18 1201 08/22/18 1735 08/22/18 2321 08/23/18 0558  GLUCAP 87 84 90 85 81   Lipid Profile: Recent Labs    08/23/18 0539  CHOL 239*  HDL 38*  LDLCALC 173*  TRIG 138  CHOLHDL 6.3   Thyroid Function Tests: Recent Labs    08/22/18 0102  TSH 0.635   Anemia Panel: Recent Labs    08/22/18 0102  VITAMINB12 372   Sepsis Labs: No results for input(s): PROCALCITON, LATICACIDVEN in the last 168 hours.  Recent Results (from the past 240 hour(s))  MRSA PCR Screening     Status: None   Collection Time: 08/21/18 11:37 PM  Result Value Ref Range Status   MRSA by PCR NEGATIVE NEGATIVE Final    Comment:        The GeneXpert MRSA Assay (FDA approved for NASAL specimens only), is one component of a comprehensive MRSA colonization surveillance program. It is not intended to diagnose MRSA infection nor to guide or monitor treatment for MRSA infections. Performed at [facility], [address] [place], [place] [address]          Radiology Studies: Mr Shirlee Latch MV Contrast  Result Date: 08/22/2018 CLINICAL DATA:  85 y/o  F; left-sided weakness and altered mental status. Focal neuro deficit, > 6 hrs, stroke suspected. EXAM: MRI HEAD WITHOUT CONTRAST MRA HEAD WITHOUT CONTRAST TECHNIQUE: Multiplanar, multiecho pulse sequences of the brain and surrounding structures were obtained without intravenous contrast. Angiographic images of the head were obtained using MRA technique without contrast. COMPARISON:  08/21/2018 CT head FINDINGS: MRI HEAD FINDINGS Brain: Reduced diffusion in the right lateral parietal lobe with some extension into the right posterior temporal lobe and occipital lobe compatible with acute/early subacute infarction. Additional acute/early subacute infarctions are present in the right basal ganglia involving lentiform and caudate nuclei. Areas of stroke are  T2 FLAIR hyperintense. No acute hemorrhage. No extra-axial collection, hydrocephalus, mass effect, or herniation. Punctate foci of susceptibility hypointensity in right temporal and left parietal lobe are compatible hemosiderin deposition of chronic microhemorrhage and in a nonspecific distribution. Very small chronic infarcts are present within the bilateral cerebellar hemispheres, bilateral thalami, and the left lentiform nucleus. Large chronic nonspecific T2 FLAIR hyperintensities in subcortical and periventricular white matter are compatible with advanced chronic microvascular ischemic changes. Volume loss of the brain is stable and asymmetric in the anterior temporal lobes. Vascular: As below. Skull and upper cervical spine: Normal marrow signal. Sinuses/Orbits: Negative. Other: Right intra-ocular lens replacement. MRA HEAD FINDINGS Internal carotid arteries: Patent. Mild lumen irregularity of carotid siphons compatible with atherosclerosis with mild bilateral paraclinoid stenosis. Anterior cerebral arteries:  Patent. Middle cerebral arteries: Severe right M1 stenosis with diminished flow related signal in the right MCA distribution. Mild left M1 stenosis. Anterior communicating artery: Patent. Posterior communicating arteries:  Patent. Posterior cerebral arteries: Right P1 and tandem left P1/P2 segments of moderate to severe stenosis. Basilar artery:  Patent. Vertebral arteries:  Patent. Motion artifact at level of circle-of-Willis, suboptimal assessment for small aneurysm or subtle stenosis. No large vessel occlusion identified. IMPRESSION: 1. Acute/early subacute infarction involving the right lateral parietal lobe with extension into the right temporal and occipital lobes as well as the right lentiform and caudate nuclei. No hemorrhage or mass effect. 2. Multiple small chronic infarcts are present within the cerebellum and basal ganglia. 3. Advanced chronic microvascular ischemic changes and volume loss of  the brain. 4. Asymmetric volume loss of anterior temporal lobes may represent neurodegenerative disease such as Alzheimer's or frontotemporal lobar degeneration. 5. Severe right M1 stenosis with asymmetric diminished flow related signal in the right MCA distribution. 6. Tandem segments of moderate to severe stenosis in bilateral proximal PCA. 7. No intracranial large vessel occlusion identified. These results will be called to the ordering clinician or representative by the Radiologist Assistant, and communication documented in the PACS or zVision Dashboard. Electronically Signed   By: Mitzi Hansen M.D.   On: 08/22/2018 05:14   Mr Brain Wo Contrast  Result Date: 08/22/2018 CLINICAL DATA:  85 y/o F; left-sided weakness and altered mental status. Focal neuro deficit, > 6 hrs, stroke suspected. EXAM: MRI HEAD WITHOUT CONTRAST MRA HEAD WITHOUT CONTRAST TECHNIQUE: Multiplanar, multiecho pulse sequences of the brain and surrounding structures were obtained without intravenous contrast. Angiographic images of the head were obtained using MRA technique without contrast. COMPARISON:  08/21/2018 CT head FINDINGS: MRI HEAD FINDINGS Brain: Reduced diffusion in the right lateral parietal lobe with some extension into the right posterior temporal lobe and occipital lobe compatible with acute/early subacute infarction. Additional acute/early subacute infarctions are present in the right basal ganglia involving lentiform and caudate nuclei. Areas of stroke are T2 FLAIR hyperintense. No acute hemorrhage. No extra-axial collection, hydrocephalus, mass effect,  or herniation. Punctate foci of susceptibility hypointensity in right temporal and left parietal lobe are compatible hemosiderin deposition of chronic microhemorrhage and in a nonspecific distribution. Very small chronic infarcts are present within the bilateral cerebellar hemispheres, bilateral thalami, and the left lentiform nucleus. Large chronic nonspecific  T2 FLAIR hyperintensities in subcortical and periventricular white matter are compatible with advanced chronic microvascular ischemic changes. Volume loss of the brain is stable and asymmetric in the anterior temporal lobes. Vascular: As below. Skull and upper cervical spine: Normal marrow signal. Sinuses/Orbits: Negative. Other: Right intra-ocular lens replacement. MRA HEAD FINDINGS Internal carotid arteries: Patent. Mild lumen irregularity of carotid siphons compatible with atherosclerosis with mild bilateral paraclinoid stenosis. Anterior cerebral arteries:  Patent. Middle cerebral arteries: Severe right M1 stenosis with diminished flow related signal in the right MCA distribution. Mild left M1 stenosis. Anterior communicating artery: Patent. Posterior communicating arteries:  Patent. Posterior cerebral arteries: Right P1 and tandem left P1/P2 segments of moderate to severe stenosis. Basilar artery:  Patent. Vertebral arteries:  Patent. Motion artifact at level of circle-of-Willis, suboptimal assessment for small aneurysm or subtle stenosis. No large vessel occlusion identified. IMPRESSION: 1. Acute/early subacute infarction involving the right lateral parietal lobe with extension into the right temporal and occipital lobes as well as the right lentiform and caudate nuclei. No hemorrhage or mass effect. 2. Multiple small chronic infarcts are present within the cerebellum and basal ganglia. 3. Advanced chronic microvascular ischemic changes and volume loss of the brain. 4. Asymmetric volume loss of anterior temporal lobes may represent neurodegenerative disease such as Alzheimer's or frontotemporal lobar degeneration. 5. Severe right M1 stenosis with asymmetric diminished flow related signal in the right MCA distribution. 6. Tandem segments of moderate to severe stenosis in bilateral proximal PCA. 7. No intracranial large vessel occlusion identified. These results will be called to the ordering clinician or  representative by the Radiologist Assistant, and communication documented in the PACS or zVision Dashboard. Electronically Signed   By: Mitzi Hansen M.D.   On: 08/22/2018 05:14   Dg Chest Port 1 View  Result Date: 08/21/2018 CLINICAL DATA:  Left-sided weakness.  Altered mental status EXAM: PORTABLE CHEST 1 VIEW COMPARISON:  03/23/2013 FINDINGS: Cardiomegaly with vascular congestion. Interstitial prominence could reflect early interstitial edema. No confluent opacities or effusions. No acute bony abnormality. IMPRESSION: Cardiomegaly with vascular congestion and possible early interstitial edema. Electronically Signed   By: Charlett Nose M.D.   On: 08/21/2018 20:56   Dg Swallowing Func-speech Pathology  Result Date: 08/22/2018 Objective Swallowing Evaluation: Type of Study: MBS-Modified Barium Swallow Study  Patient Details Name: Lisa Watts MRN: [id number] Date of Birth: [birth year] Today's Date: 08/22/2018 Time: SLP Start Time (ACUTE ONLY): 1345 -SLP Stop Time (ACUTE ONLY): 1404 SLP Time Calculation (min) (ACUTE ONLY): 19 min Past Medical History: Past Medical History: Diagnosis Date . Anxiety and depression  . Chronic pain  . COPD (chronic obstructive pulmonary disease) (HCC)  . Dementia (HCC)  . Essential hypertension  . GERD (gastroesophageal reflux disease)  . History of GI bleed 2011 . Left leg cellulitis  . Osteoarthritis  . Osteoporosis with fracture  Past Surgical History: Past Surgical History: Procedure Laterality Date . CHOLECYSTECTOMY   . FEMUR IM NAIL Left 03/21/2013  Procedure: INTRAMEDULLARY (IM) NAIL FEMORAL;  Surgeon: Verlee Rossetti, MD;  [facility];  Service: Orthopedics;  Laterality: Left; HPI: [name] with medical history significant of anxiety and depression, chronic pain, COPD, vascular dementia, essential hypertension, GERD, history  of GI bleed, history of left lower extremity cellulitis, osteoarthritis, history of osteoporosis and left femoral fracture  who was sent from [facility] nursing home for evaluation of altered mental status with new left-sided weakness.  The MRI of 08/22/18 revealed acute/early subacute infarction involving the right lateral parietal lobe with extension into the right temporal and occipital lobes as well as the right lentiform and caudate nuclei. Pt's nurse from [facility] reported that the pt was on mechanical soft (ground) and thin liquids and she was being seen by speech pathology.  No data recorded Assessment / Plan / Recommendation CHL IP CLINICAL IMPRESSIONS 08/22/2018 Clinical Impression Pt presents with moderate-severe oropharyngeal dysphagia. The oral phase of the swallow was significant for oral holding and impaired A-P transport. Pharyngeally, she demonstrated a pharyngeal delay which resulted in penetration of thin liquids before and during deglutition. Penetration was to the level of vocal folds and resulted in eventual aspiration. Instances of aspiration resulted in a delayed cough which did not fully expell the aspirant. No instances of penetration/aspiration were noted with nectar thick liquids. Trace vallecular and pyriform sinus residue was noted but this is within normal limits for the pt's age. A dysphagia 1 (puree) diet with nectar thick liquids is recommended at this time and SLP will follow to assess tolerance of the current diet.  SLP Visit Diagnosis Dysphagia, oropharyngeal phase (R13.12) Attention and concentration deficit following -- Frontal lobe and executive function deficit following -- Impact on safety and function Moderate aspiration risk   CHL IP TREATMENT RECOMMENDATION 08/22/2018 Treatment Recommendations Therapy as outlined in treatment plan below   Prognosis 08/22/2018 Prognosis for Safe Diet Advancement Fair Barriers to Reach Goals Cognitive deficits Barriers/Prognosis Comment -- CHL IP DIET RECOMMENDATION 08/22/2018 SLP Diet Recommendations Dysphagia 1 (Puree) solids;Nectar thick liquid Liquid  Administration via Cup;Straw Medication Administration Crushed with puree Compensations Slow rate;Small sips/bites Postural Changes Remain semi-upright after after feeds/meals (Comment);Seated upright at 90 degrees   CHL IP OTHER RECOMMENDATIONS 08/22/2018 Recommended Consults -- Oral Care Recommendations -- Other Recommendations Order thickener from pharmacy   CHL IP FOLLOW UP RECOMMENDATIONS 08/22/2018 Follow up Recommendations Skilled Nursing facility   [facility] IP FREQUENCY AND DURATION 08/22/2018 Speech Therapy Frequency (ACUTE ONLY) min 2x/week Treatment Duration 2 weeks      CHL IP ORAL PHASE 08/22/2018 Oral Phase Impaired Oral - Pudding Teaspoon -- Oral - Pudding Cup -- Oral - Honey Teaspoon -- Oral - Honey Cup -- Oral - Nectar Teaspoon -- Oral - Nectar Cup -- Oral - Nectar Straw Delayed oral transit;Holding of bolus Oral - Thin Teaspoon -- Oral - Thin Cup -- Oral - Thin Straw Delayed oral transit;Holding of bolus Oral - Puree Delayed oral transit;Holding of bolus Oral - Mech Soft -- Oral - Regular -- Oral - Multi-Consistency -- Oral - Pill -- Oral Phase - Comment --  CHL IP PHARYNGEAL PHASE 08/22/2018 Pharyngeal Phase Impaired Pharyngeal- Pudding Teaspoon -- Pharyngeal -- Pharyngeal- Pudding Cup -- Pharyngeal -- Pharyngeal- Honey Teaspoon -- Pharyngeal -- Pharyngeal- Honey Cup -- Pharyngeal -- Pharyngeal- Nectar Teaspoon -- Pharyngeal -- Pharyngeal- Nectar Cup -- Pharyngeal -- Pharyngeal- Nectar Straw Delayed swallow initiation-pyriform sinuses;Delayed swallow initiation-vallecula Pharyngeal -- Pharyngeal- Thin Teaspoon -- Pharyngeal -- Pharyngeal- Thin Cup -- Pharyngeal -- Pharyngeal- Thin Straw Delayed swallow initiation-pyriform sinuses;Penetration/Aspiration before swallow;Penetration/Aspiration during swallow Pharyngeal Material enters airway, CONTACTS cords and not ejected out;Material enters airway, passes BELOW cords and not ejected out despite cough attempt by patient Pharyngeal- Puree -- Pharyngeal --  Pharyngeal- Mechanical Soft --  Pharyngeal -- Pharyngeal- Regular -- Pharyngeal -- Pharyngeal- Multi-consistency -- Pharyngeal -- Pharyngeal- Pill -- Pharyngeal -- Pharyngeal Comment --  CHL IP CERVICAL ESOPHAGEAL PHASE 08/22/2018 Cervical Esophageal Phase WFL Pudding Teaspoon -- Pudding Cup -- Honey Teaspoon -- Honey Cup -- Nectar Teaspoon -- Nectar Cup -- Nectar Straw -- Thin Teaspoon -- Thin Cup -- Thin Straw -- Puree -- Mechanical Soft -- Regular -- Multi-consistency -- Pill -- Cervical Esophageal Comment -- Shanika I. Vear Clock, MS, CCC-SLP Acute Rehabilitation Services Office number [phone] Pager [phone] [name] 08/22/2018, 3:11 PM              Ct Head Code Stroke Wo Contrast  Result Date: 08/21/2018 CLINICAL DATA:  Code stroke. Found down. LEFT-sided weakness and LEFT facial droop. History of hypertension and dementia. EXAM: CT HEAD WITHOUT CONTRAST TECHNIQUE: Contiguous axial images were obtained from the base of the skull through the vertex without intravenous contrast. COMPARISON:  CT HEAD March 20, 2013 FINDINGS: BRAIN: New RIGHT thalamus and RIGHT basal ganglia infarcts. Old LEFT basal ganglia and thalamus infarcts. Small area LEFT occipital lobe encephalomalacia is new. Old small LEFT cerebellar infarct. No intraparenchymal hemorrhage, mass effect nor midline shift. Moderate to severe parenchymal brain volume loss with disproportion mesial temporal lobe atrophy. No hydrocephalus. Patchy supratentorial white matter hypodensities within normal range for patient's age, though non-specific are most compatible with chronic small vessel ischemic disease. No acute large vascular territory infarcts. No abnormal extra-axial fluid collections. Basal cisterns are patent. VASCULAR: Moderate calcific atherosclerosis of the carotid siphons. SKULL: No skull fracture. No significant scalp soft tissue swelling. SINUSES/ORBITS: Paranasal sinuses are well aerated. Mastoid air cells are well  aerated.The included ocular globes and orbital contents are non-suspicious. Status post RIGHT ocular lens implant. OTHER: None. ASPECTS ([place] Stroke Program Early CT Score) - Ganglionic level infarction (caudate, lentiform nuclei, internal capsule, insula, M1-M3 cortex): 6 - Supraganglionic infarction (M4-M6 cortex): 3 Total score (0-10 with 10 being normal): 9 IMPRESSION: 1. New age indeterminate RIGHT basal ganglia and RIGHT thalamus infarcts. 2. ASPECTS is 9. 3. Old LEFT basal ganglia and thalamus infarcts. New chronic appearing LEFT occipital lobe/PCA territory infarct. Old small LEFT cerebellar infarct. 4. Advanced temporal lobe atrophy seen with neuro degenerative syndromes. 5. Critical Value/emergent results were called by telephone at the time of interpretation on 08/21/2018 at 5:02 pm to Dr. Adriana Simas , who verbally acknowledged these results. Electronically Signed   By: Awilda Metro M.D.   On: 08/21/2018 17:02   Vas US Carotid  Result Date: 08/23/2018 Carotid Arterial Duplex Study Indications: CVA. Limitations: Patient cannot turn head/ high bifurcation Performing Technologist: Jeb Levering RDMS, RVT  Examination Guidelines: A complete evaluation includes B-mode imaging, spectral Doppler, color Doppler, and power Doppler as needed of all accessible portions of each vessel. Bilateral testing is considered an integral part of a complete examination. Limited examinations for reoccurring indications may be performed as noted.  Right Carotid Findings: +----------+--------+--------+--------+------------+------------------+           PSV cm/sEDV cm/sStenosisDescribe    Comments           +----------+--------+--------+--------+------------+------------------+ CCA Prox  60      16              heterogenous                   +----------+--------+--------+--------+------------+------------------+ CCA Distal53      17                                              +----------+--------+--------+--------+------------+------------------+  ICA Prox  64      20      1-39%               intimal thickening +----------+--------+--------+--------+------------+------------------+ ICA Distal72      20                                             +----------+--------+--------+--------+------------+------------------+ ECA       135     13                                             +----------+--------+--------+--------+------------+------------------+ +----------+--------+-------+--------------+-------------------+           PSV cm/sEDV cmsDescribe      Arm Pressure (mmHG) +----------+--------+-------+--------------+-------------------+ Subclavian               Not identified                    +----------+--------+-------+--------------+-------------------+ +---------+--------+--+--------+--+---------+ VertebralPSV cm/s52EDV cm/s17Antegrade +---------+--------+--+--------+--+---------+  Left Carotid Findings: +----------+--------+--------+--------+----------+--------+           PSV cm/sEDV cm/sStenosisDescribe  Comments +----------+--------+--------+--------+----------+--------+ CCA Prox  85      16                                 +----------+--------+--------+--------+----------+--------+ CCA Distal70      22                                 +----------+--------+--------+--------+----------+--------+ ICA Prox  62      16      1-39%   hypoechoictortuous +----------+--------+--------+--------+----------+--------+ ICA Distal63      19                                 +----------+--------+--------+--------+----------+--------+ ECA       47      5                                  +----------+--------+--------+--------+----------+--------+ +----------+--------+--------+--------------+-------------------+ SubclavianPSV cm/sEDV cm/sDescribe      Arm Pressure (mmHG)  +----------+--------+--------+--------------+-------------------+                           Not identified                    +----------+--------+--------+--------------+-------------------+ +---------+--------+--+--------+--+---------+ VertebralPSV cm/s57EDV cm/s18Antegrade +---------+--------+--+--------+--+---------+  Summary: Right Carotid: Velocities in the right ICA are consistent with a 1-39% stenosis. Left Carotid: Velocities in the left ICA are consistent with a 1-39% stenosis. Vertebrals:  Bilateral vertebral arteries demonstrate antegrade flow. Subclavians: Bilateral subclavian arteries were not visualized. *See table(s) above for measurements and observations.  Electronically signed by Delia HeadyPramod Sethi MD on 08/23/2018 at 8:22:36 AM.    Final         Scheduled Meds: . aspirin  300 mg Rectal Daily   Or  . aspirin  325 mg Oral Daily  . busPIRone  10 mg Oral BID  . calcium carbonate  1 tablet Oral  TID  . chlorhexidine  15 mL Mouth Rinse BID  . clopidogrel  75 mg Oral Daily  . enoxaparin (LOVENOX) injection  30 mg Subcutaneous Q24H  . feeding supplement  237 mL Oral TID  . furosemide  40 mg Oral Daily  . mouth rinse  15 mL Mouth Rinse q12n4p  . metoprolol tartrate  12.5 mg Oral BID  . mometasone-formoterol  2 puff Inhalation BID  . multivitamin with minerals  1 tablet Oral Daily  . potassium chloride  40 mEq Oral Once  . umeclidinium bromide  1 puff Inhalation Daily   Continuous Infusions:    LOS: 2 days    Time spent: 35    Delaine Lame, MD [facility]  If 7PM-7AM, please contact night-coverage [email or web address] Password [facility] 08/23/2018, 10:04 AM

## 2018-08-23 NOTE — Procedures (Signed)
ELECTROENCEPHALOGRAM REPORT   Patient: Lisa Watts       Room #: 1A57X EEG No. ID: [id number] Age: 85 y.o.        Sex: female Referring Physician: Purochit Report Date:  08/23/2018        Interpreting Physician: Thana Farr  History: AMORRA YARRELL is an 85 y.o. female with left sided weakness and AMS  Medications:  ASA, Plavix, Buspar, Lasix, Lopressor, MVI  Conditions of Recording:  This is a 21 channel routine scalp EEG performed with bipolar and monopolar montages arranged in accordance to the international 10/20 system of electrode placement. One channel was dedicated to EKG recording.  The patient is in the awake and uncoperative state.  Description:  Artifact is prominent during the recording often obscuring the background rhythm. When able to be visualized the background is slow and poorly organized.   It consists of a low voltage, polymorphic delta rhythm that is diffusely distributed and continuous.  There is some occasional intermixed theta activty noted as well.  No epileptiform activity is noted.   Hyperventilation and intermittent photic stimulation were not performed.   IMPRESSION: This is an abnormal EEG secondary to general background slowing.  This finding may be seen with a diffuse disturbance that is etiologically nonspecific, but may include a metabolic encephalopathy, among other possibilities.  No epileptiform activity was noted.     Thana Farr, MD Neurology [phone] 08/23/2018, 12:44 PM

## 2018-08-23 NOTE — Progress Notes (Signed)
  Speech Language Pathology Treatment: Dysphagia  Patient Details Name: Lisa Watts MRN: [id number] DOB: 03/09/1925 Today's Date: 08/23/2018 Time: 2419-9144 SLP Time Calculation (min) (ACUTE ONLY): 25 min  Assessment / Plan / Recommendation Clinical Impression  Pt was seen for dysphagia treatment to assess tolerance of the recommended diet. Lisa Grieve, RN reported that the pt's p.o. intake has been limited and that she was demonstrating a tongue thrust during attempts at feeding this morning. Pt tolerated puree solids and up to 2 consecutive swallows of nectar thick liquids via straw without overt s/sx of aspiration. However, coughing was noted once with 5 consecutive swallows. She required cues to open her mouth adequately to accept puree boluses and liquid washes were necessary to clear mild oral residue. However, frequency of cueing and participation improved as the session progressed. Considering her reduced p.o. intake, nutritional supplementation may be benefitial and Lisa Grieve, RN has consulted RD for this reason. SLP will continue to follow to ensure continued tolerance.    HPI HPI: [name] with medical history significant of anxiety and depression, chronic pain, COPD, vascular dementia, essential hypertension, GERD, history of GI bleed, history of left lower extremity cellulitis, osteoarthritis, history of osteoporosis and left femoral fracture who was sent from [facility] nursing home for evaluation of altered mental status with new left-sided weakness.  The MRI of 08/22/18 revealed acute/early subacute infarction involving the right lateral parietal lobe with extension into the right temporal and occipital lobes as well as the right lentiform and caudate nuclei. Pt's nurse from [facility] reported that the pt was on mechanical soft (ground) and thin liquids and she was being seen by speech pathology.       SLP Plan  Continue with current plan of care       Recommendations   Diet recommendations: Dysphagia 1 (puree);Nectar-thick liquid Liquids provided via: Straw;Cup Medication Administration: Crushed with puree Supervision: Full supervision/cueing for compensatory strategies Compensations: Slow rate;Small sips/bites;Follow solids with liquid Postural Changes and/or Swallow Maneuvers: Seated upright 90 degrees;Upright 30-60 min after meal                Follow up Recommendations: Skilled Nursing facility SLP Visit Diagnosis: Dysphagia, oropharyngeal phase (R13.12) Plan: Continue with current plan of care       Lisa Watts I. Vear Clock, MS, CCC-SLP Acute Rehabilitation Services Office number [phone] Pager [phone]               Lisa Watts 08/23/2018, 10:29 AM

## 2018-08-23 NOTE — Consult Note (Signed)
Consultation Note Date: 08/23/2018   Patient Name: Lisa Watts  DOB: 03-17-1925  MRN: [id number]  Age / Sex: 85 y.o., female  PCP: Colon Branch, MD Referring Physician: Delaine Lame, MD  Reason for Consultation: Establishing goals of care and Psychosocial/spiritual support  HPI/Patient Profile: 85 y.o. female  with past medical history of  dementia, anxiety/depression, COPD, hypertension, hx of stroke, bed bound, SNF resident [facility] years admitted on 08/21/2018 with acute CVA, large, right involving parietal with extension to temporal and occipital lobes.    Clinical Assessment and Goals of Care: Mrs. Ormiston is lying quietly in bed.  She appears frail, chronically ill.  She will open her eyes but not make eye contact, is able to tell me her name but not where we are.  She is calm and cooperative, not fearful.  She has what looks to be applesauce pocketed in her mouth.  She is able to take sips of thickened liquids without overt signs and symptoms of aspiration.  There is no family at bedside at this time.  Conference with social work related to goals of care/disposition.  DSS social worker for [employer] has endorsed to allow a natural death (DNR).  Paperwork completed, conference with Dr. Lauretta Chester to complete paperwork.  Goldenrod form completed.  Conference with the DSS social worker related to goals of care.  DSS social worker Spell is able to set hospice type services in place once returned to residential SNF.  HCPOA   LEGAL GUARDIAN  - DSS [facility], [address]    SUMMARY OF RECOMMENDATIONS   DNR, golden rod form completed Return to [facility] residential SNF  Do not rehospitalize, no tube feeding.  Recommending hospice care  Code Status/Advance Care Planning:  DNR  Symptom Management:   Per hospitalist, no additional needs at this time.   Palliative  Prophylaxis:   Aspiration, Oral Care and Turn Reposition  Additional Recommendations (Limitations, Scope, Preferences):  Treat the treatable but no extraordinary measures such as CPR or intubation, no PEG tube, do not rehospitalize.  Psycho-social/Spiritual:   Desire for further Chaplaincy support:no  Additional Recommendations: Caregiving  Support/Resources and Education on Hospice  Prognosis:   < 3 months, would not be surprising based on acute large stroke, eementia, frailty and poor functional status.   Discharge Planning: Return to residential SNF, recommending hospice care.      Primary Diagnoses: Present on Admission: . Acute CVA (cerebrovascular accident) (HCC) . COPD (chronic obstructive pulmonary disease) (HCC) . GERD (gastroesophageal reflux disease) . Essential hypertension . Anxiety and depression . Dementia (HCC) . Abnormal EKG   I have reviewed the medical record, interviewed the patient and family, and examined the patient. The following aspects are pertinent.  Past Medical History:  Diagnosis Date  . Anxiety and depression   . Chronic pain   . COPD (chronic obstructive pulmonary disease) (HCC)   . Dementia (HCC)   . Essential hypertension   . GERD (gastroesophageal reflux disease)   . History of  GI bleed 2011  . Left leg cellulitis   . Osteoarthritis   . Osteoporosis with fracture    Social History   Socioeconomic History  . Marital status: Widowed    Spouse name: Not on file  . Number of children: Not on file  . Years of education: Not on file  . Highest education level: Not on file  Occupational History  . Not on file  Social Needs  . Financial resource strain: Not on file  . Food insecurity:    Worry: Not on file    Inability: Not on file  . Transportation needs:    Medical: Not on file    Non-medical: Not on file  Tobacco Use  . Smoking status: Never Smoker  Substance and Sexual Activity  . Alcohol use: No    Alcohol/week: 0.0  standard drinks  . Drug use: No  . Sexual activity: Not on file  Lifestyle  . Physical activity:    Days per week: Not on file    Minutes per session: Not on file  . Stress: Not on file  Relationships  . Social connections:    Talks on phone: Not on file    Gets together: Not on file    Attends religious service: Not on file    Active member of club or organization: Not on file    Attends meetings of clubs or organizations: Not on file    Relationship status: Not on file  Other Topics Concern  . Not on file  Social History Narrative  . Not on file   History reviewed. No pertinent family history. Scheduled Meds: . aspirin  300 mg Rectal Daily   Or  . aspirin  325 mg Oral Daily  . busPIRone  10 mg Oral BID  . calcium carbonate  1 tablet Oral TID  . chlorhexidine  15 mL Mouth Rinse BID  . clopidogrel  75 mg Oral Daily  . enoxaparin (LOVENOX) injection  30 mg Subcutaneous Q24H  . feeding supplement  237 mL Oral TID  . furosemide  40 mg Oral Daily  . mouth rinse  15 mL Mouth Rinse q12n4p  . metoprolol tartrate  12.5 mg Oral BID  . mometasone-formoterol  2 puff Inhalation BID  . multivitamin with minerals  1 tablet Oral Daily  . umeclidinium bromide  1 puff Inhalation Daily   Continuous Infusions: PRN Meds:.acetaminophen **OR** acetaminophen (TYLENOL) oral liquid 160 mg/5 mL **OR** acetaminophen, ipratropium-albuterol, loperamide, ondansetron **OR** ondansetron (ZOFRAN) IV, RESOURCE THICKENUP CLEAR Medications Prior to Admission:  Prior to Admission medications   Medication Sig Start Date End Date Taking? Authorizing Provider  acetaminophen (TYLENOL) 500 MG tablet Take 2 tablets (1,000 mg total) by mouth 2 (two) times daily. 03/25/13  Yes Ghimire, Werner Lean, MD  aspirin EC 81 MG tablet Take 81 mg by mouth daily.   Yes [provider]  busPIRone (BUSPAR) 10 MG tablet Take 10 mg by mouth 2 (two) times daily.   Yes [provider]  calcium carbonate (TUMS -  DOSED IN MG ELEMENTAL CALCIUM) 500 MG chewable tablet Chew 1 tablet by mouth 3 (three) times daily.   Yes [provider]  Cholecalciferol (VITAMIN D3) 125 MCG (5000 UT) TABS Take 5,000 Units by mouth daily.    Yes [provider]  denosumab (PROLIA) 60 MG/ML SOSY injection Inject 60 mg into the skin every 6 (six) months. Start date 03/29/2017 per [facility]   Yes [provider]  Eyelid Cleansers (EYESCRUB) PADS  Place 1 each into both eyes daily.   Yes [provider]  formoterol (PERFOROMIST) 20 MCG/2ML nebulizer solution Take 20 mcg by nebulization every 12 (twelve) hours.   Yes [provider]  furosemide (LASIX) 40 MG tablet Take 40 mg by mouth daily.   Yes [provider]  loperamide (IMODIUM) 2 MG capsule Take 2 mg by mouth 4 (four) times daily as needed for diarrhea or loose stools.   Yes [provider]  metoprolol tartrate (LOPRESSOR) 25 MG tablet Take 12.5 mg by mouth 2 (two) times daily.   Yes [provider]  Multiple Vitamins-Minerals (CERTAVITE/ANTIOXIDANTS) TABS Take 1 tablet by mouth daily.   Yes [provider]  Nutritional Supplements (RESOURCE 2.0) LIQD Take 120 mLs by mouth 3 (three) times daily.   Yes [provider]   No Known Allergies Review of Systems  Unable to perform ROS: Dementia    Physical Exam Vitals signs and nursing note reviewed.  Constitutional:      Comments: Known dementia, appears frail, acutely chronically ill  HENT:     Head: Atraumatic.     Mouth/Throat:     Mouth: Mucous membranes are moist.  Cardiovascular:     Rate and Rhythm: Normal rate.  Pulmonary:     Effort: Pulmonary effort is normal. No respiratory distress.  Abdominal:     General: Abdomen is flat.     Tenderness: There is no abdominal tenderness.  Skin:    General: Skin is warm and dry.  Neurological:     Mental Status: She is alert.     Comments: Alert, cooperative, known dementia, oriented to  self only  Psychiatric:     Comments: Not fearful     Vital Signs: BP (!) 144/67 (BP Location: Left Arm)   Pulse 87   Temp 97.9 F (36.6 C) (Axillary)   Resp 15   Ht 5\' 8"  (1.727 m)   Wt 61.8 kg   SpO2 97%   BMI 20.72 kg/m  Pain Scale: Faces       SpO2: SpO2: 97 % O2 Device:SpO2: 97 % O2 Flow Rate: .   IO: Intake/output summary:   Intake/Output Summary (Last 24 hours) at 08/23/2018 1324 Last data filed at 08/23/2018 9604 Gross per 24 hour  Intake 50 ml  Output 675 ml  Net -625 ml    LBM:   Baseline Weight: Weight: 55.1 kg Most recent weight: Weight: 61.8 kg     Palliative Assessment/Data:   Flowsheet Rows     Most Recent Value  Intake Tab  Referral Department  Hospitalist  Unit at Time of Referral  Cardiac/Telemetry Unit  Palliative Care Primary Diagnosis  Neurology  Date Notified  08/21/18  Palliative Care Type  New Palliative care  Reason for referral  Clarify Goals of Care  Date of Admission  08/21/18  Date first seen by Palliative Care  08/23/18  # of days Palliative referral response time  2 Day(s)  # of days IP prior to Palliative referral  0  Clinical Assessment  Palliative Performance Scale Score  30%  Pain Max last 24 hours  Not able to report  Pain Min Last 24 hours  Not able to report  Dyspnea Max Last 24 Hours  Not able to report  Dyspnea Min Last 24 hours  Not able to report  Psychosocial & Spiritual Assessment  Palliative Care Outcomes  Patient/Family meeting held?  Yes  Palliative Care Outcomes  Counseled regarding hospice, Provided advance care planning,  Completed durable DNR, Changed CPR status, Provided psychosocial or spiritual support, Clarified goals of care  Patient/Family wishes: Interventions discontinued/not started   Mechanical Ventilation, BiPAP, PEG, Tube feedings/TPN      Time In: 1005 Time Out: 1155 Time Total: 110 minutes Greater than 50%  of this time was spent counseling and coordinating care related to the above  assessment and plan.  Signed by: Katheran Awe, NP   Please contact Palliative Medicine Team phone at [phone] for questions and concerns.  For individual provider: See Loretha Stapler

## 2018-08-23 NOTE — Progress Notes (Signed)
CSW received paperwork from RN that he received from patient's legal guardian, Barnie Alderman. CSW reached out to guardian to discuss faxed paperwork. Melissa would like to make the patient a DNR, faxed the paperwork required in order to facilitate the order change. Per Efraim Kaufmann, she didn't realize that the patient had gotten as old as she is because she's been a ward of the state for over 10 years, and she should have been made a DNR a long time ago. Efraim Kaufmann is aware that the patient will likely have more strokes, and they do not want to pursue aggressive care for the patient moving forward; they would just want to keep her comfortable at her facility.   CSW sent message to Palliative NP Tasha assigned to patient today to make her aware of the paperwork. CSW to continue to follow. Plan is for patient to return to [facility] at discharge.  Blenda Nicely, [place] Clinical Social Worker [phone]

## 2018-08-23 NOTE — Progress Notes (Signed)
Pt's troponin 0.03 x2. [facility] made aware. No distress. Waiting for orders.Will continue to monitor.

## 2018-08-23 NOTE — Progress Notes (Signed)
Initial Nutrition Assessment  DOCUMENTATION CODES:   Not applicable  INTERVENTION:  Continue Boost Breeze po TID (thickened to appropriate consistency), each supplement provides 250 kcal and 9 grams of protein.  NUTRITION DIAGNOSIS:   Inadequate oral intake related to dysphagia as evidenced by meal completion < 25%.  GOAL:   Patient will meet greater than or equal to 90% of their needs  MONITOR:   PO intake, Supplement acceptance, Labs, Weight trends, I & O's, Skin, Diet advancement  REASON FOR ASSESSMENT:   Consult Assessment of nutrition requirement/status  ASSESSMENT:   85 y.o. female with history of anxiety, depression, chronic pain, COPD, vascular dementia, essential hypertension, GERD, history of GI bleed, history of left lower extremity cellulitis, osteoarthritis, history of osteoporosis and left femoral fracture. presenting with acute onset of left sided weakness and AMS. Pt with R MCA infarct.  Pt asleep during time of visit and did not awaken during RD assessment. No family at bedside. RD unable to obtain most recent nutrition history. Pt is currently on a dysphagia 1 diet with nectar thick liquids. MD has ordered nutritional supplements to aid in caloric and protein needs. RD to continue with current orders. Noted, per RN pt has been spitting out food even when directed to swallow. SLP following. RD to continue to monitor.   NUTRITION - FOCUSED PHYSICAL EXAM: Depletion likely related to the natural aging process.    Most Recent Value  Orbital Region  Unable to assess  Upper Arm Region  No depletion  Thoracic and Lumbar Region  No depletion  Buccal Region  Unable to assess  Temple Region  Mild depletion  Clavicle Bone Region  No depletion  Clavicle and Acromion Bone Region  No depletion  Scapular Bone Region  Unable to assess  Dorsal Hand  Unable to assess  Patellar Region  Mild depletion  Anterior Thigh Region  Mild depletion  Posterior Calf Region  Mild  depletion  Edema (RD Assessment)  None  Hair  Reviewed  Eyes  Unable to assess  Mouth  Reviewed  Skin  Reviewed  Nails  Reviewed       Diet Order:   Diet Order            DIET - DYS 1 Room service appropriate? Yes; Fluid consistency: Nectar Thick  Diet effective now              EDUCATION NEEDS:   Not appropriate for education at this time  Skin:  Skin Assessment: Reviewed RN Assessment  Last BM:  Unknown  Height:   Ht Readings from Last 1 Encounters:  08/21/18 5\' 8"  (1.727 m)    Weight:   Wt Readings from Last 1 Encounters:  08/21/18 61.8 kg    Ideal Body Weight:  63.6 kg  BMI:  Body mass index is 20.72 kg/m.  Estimated Nutritional Needs:   Kcal:  1500-1650  Protein:  60-70 grams  Fluid:  >/= 1.5 L/day    Roslyn Smiling, MS, RD, LDN Pager # [phone] After hours/ weekend pager # [phone]

## 2018-08-24 ENCOUNTER — Encounter (HOSPITAL_COMMUNITY): Payer: Self-pay

## 2018-08-24 LAB — GLUCOSE, CAPILLARY
GLUCOSE-CAPILLARY: 121 mg/dL — AB (ref 70–99)
Glucose-Capillary: 104 mg/dL — ABNORMAL HIGH (ref 70–99)

## 2018-08-24 LAB — BASIC METABOLIC PANEL WITH GFR
BUN: 17 mg/dL (ref 8–23)
Sodium: 146 mmol/L — ABNORMAL HIGH (ref 135–145)

## 2018-08-24 LAB — BASIC METABOLIC PANEL
Anion gap: 14 (ref 5–15)
CO2: 24 mmol/L (ref 22–32)
Calcium: 9.2 mg/dL (ref 8.9–10.3)
Chloride: 108 mmol/L (ref 98–111)
Creatinine, Ser: 0.71 mg/dL (ref 0.44–1.00)
GFR calc Af Amer: >60 mL/min (ref >60)
GFR calc non Af Amer: >60 mL/min (ref >60)
Glucose, Bld: 100 mg/dL — ABNORMAL HIGH (ref 70–99)
Potassium: 3.3 mmol/L — ABNORMAL LOW (ref 3.5–5.1)

## 2018-08-24 MED ORDER — CLOPIDOGREL BISULFATE 75 MG PO TABS: 75.0000 mg | ORAL_TABLET | Freq: Every day | ORAL | 0 refills | Status: AC

## 2018-08-24 MED ORDER — RESOURCE THICKENUP CLEAR PO POWD: ORAL | Status: DC | PRN

## 2018-08-24 MED ORDER — POTASSIUM CHLORIDE CRYS ER 20 MEQ PO TBCR
40.0000 meq | EXTENDED_RELEASE_TABLET | Freq: Once | ORAL | Status: AC
  Administered 2018-08-24: 40 meq via ORAL
  Filled 2018-08-24: qty 2

## 2018-08-24 MED ORDER — ATORVASTATIN CALCIUM 40 MG PO TABS: 40.0000 mg | ORAL_TABLET | Freq: Every day | ORAL | 0 refills | Status: AC

## 2018-08-24 NOTE — Discharge Summary (Signed)
Physician Discharge Summary  Lisa Watts:[id number] DOB: [birth year] DOA: 08/21/2018  PCP: Colon Branch, MD  Admit date: 08/21/2018 Discharge date: 08/24/2018  Admitted From: Skilled nursing facility Disposition: Skilled nursing facility  Recommendations for Outpatient Follow-up:  1. Follow up with PCP in 1-2 weeks 2. Please obtain BMP/CBC in one week 3. Please continue your aspirin and Plavix for 3 weeks and then continue Plavix alone. 4. Please have patient be seen by palliative care.  Home Health: No Equipment/Devices: No  Discharge Condition: Stable CODE STATUS: DNR Diet recommendation: Dysphagia 1 diet, nectar thick liquids crushed medications with pure  Brief/Interim Summary:  #) Left-sided weakness/large CVA/altered mental status: Patient was admitted with left-sided weakness and found to have large right-sided stroke that extended from parietal lobe, temporal lobe, occipital lobes.  Patient was noticed also have stigmata of prior old strokes.  Patient was considered to be a failure of aspirin.  Patient was started on aspirin and clopidogrel and was told to continue this for 3 weeks and then to transition only to clopidogrel.  Echo showed normal ejection fraction with only diastolic dysfunction.  Carotid ultrasound showed no significant stenosis.  Patient was evaluated by speech-language pathology who recommended dysphagia 1 diet.  Patient was started on statin.  An EEG was performed that only showed diffuse slowing.  Due to patient's significant debility, underlying dementia, advanced age, nonambulatory status and the fact that she was apparently a legal guardian of rocking him [place] social services palliative care was consulted and was recommended the patient be made DNR and palliative care should be discussed at her nursing home.  #) NSVT: Patient had runs of NSVT.  Her electrolytes were supplemented.  Her echo showed no evidence of heart failure.  #) Pretension: Patient  was continued on metoprolol succinate after 24 hours of permissive hypertension.  #) COPD: Patient was continued on home long-acting and short acting bronchodilators.  #) Extremity edema: Patient was continued on home furosemide.  #) Pain/psych/dementia: Patient was continued on donepezil.  She will restart home meds on discharge.  Discharge Diagnoses:  Principal Problem:   Cerebrovascular accident (CVA) (HCC) Active Problems:   COPD (chronic obstructive pulmonary disease) (HCC)   GERD (gastroesophageal reflux disease)   Essential hypertension   Anxiety and depression   Dementia (HCC)   Abnormal EKG   Goals of care, counseling/discussion   Palliative care by specialist   DNR (do not resuscitate) discussion    Discharge Instructions  Discharge Instructions    Ambulatory referral to Neurology   Complete by:  As directed    Follow up with stroke clinic NP (Jessica Vanschaick or Darrol Angel, if both not available, consider Dr. Delia Heady, Dr. Jamelle Rushing, or Dr. Naomie Dean) at [facility] in about 4 weeks.  Dementia at baseline. From SNF. Plan return to SNF. Not sure if she will be able to follow up.   Call MD for:  difficulty breathing, headache or visual disturbances   Complete by:  As directed    Call MD for:  hives   Complete by:  As directed    Call MD for:  persistant dizziness or light-headedness   Complete by:  As directed    Call MD for:  persistant nausea and vomiting   Complete by:  As directed    Call MD for:  persistant nausea and vomiting   Complete by:  As directed    Call MD for:  redness, tenderness, or signs of infection (pain, swelling, redness,  odor or green/yellow discharge around incision site)   Complete by:  As directed    Call MD for:  severe uncontrolled pain   Complete by:  As directed    Call MD for:  temperature >100.4   Complete by:  As directed    Call MD for:  temperature >100.4   Complete by:  As directed     Diet - low sodium heart healthy   Complete by:  As directed    Diet - low sodium heart healthy   Complete by:  As directed    Discharge instructions   Complete by:  As directed    Please take aspirin and Plavix for 3 weeks and then discontinue aspirin.   Discharge instructions   Complete by:  As directed    Please follow-up with your primary care doctor in 1 to 2 weeks.  Please continue aspirin and Plavix for 3 weeks and then discontinue the aspirin and continue Plavix only.   Increase activity slowly   Complete by:  As directed    Increase activity slowly   Complete by:  As directed      Allergies as of 08/24/2018   No Known Allergies     Medication List    STOP taking these medications   denosumab 60 MG/ML Sosy injection Commonly known as:  PROLIA     TAKE these medications   acetaminophen 500 MG tablet Commonly known as:  TYLENOL Take 2 tablets (1,000 mg total) by mouth 2 (two) times daily.   aspirin EC 81 MG tablet Take 81 mg by mouth daily.   atorvastatin 40 MG tablet Commonly known as:  LIPITOR Take 1 tablet (40 mg total) by mouth daily at 6 PM.   busPIRone 10 MG tablet Commonly known as:  BUSPAR Take 10 mg by mouth 2 (two) times daily.   calcium carbonate 500 MG chewable tablet Commonly known as:  TUMS - dosed in mg elemental calcium Chew 1 tablet by mouth 3 (three) times daily.   CERTAVITE/ANTIOXIDANTS Tabs Take 1 tablet by mouth daily.   clopidogrel 75 MG tablet Commonly known as:  PLAVIX Take 1 tablet (75 mg total) by mouth daily.   Eyescrub Pads Place 1 each into both eyes daily.   furosemide 40 MG tablet Commonly known as:  LASIX Take 40 mg by mouth daily.   loperamide 2 MG capsule Commonly known as:  IMODIUM Take 2 mg by mouth 4 (four) times daily as needed for diarrhea or loose stools.   metoprolol tartrate 25 MG tablet Commonly known as:  LOPRESSOR Take 12.5 mg by mouth 2 (two) times daily.   PERFOROMIST 20 MCG/2ML nebulizer  solution Generic drug:  formoterol Take 20 mcg by nebulization every 12 (twelve) hours.   RESOURCE 2.0 Liqd Take 120 mLs by mouth 3 (three) times daily.   Vitamin D3 125 MCG (5000 UT) Tabs Take 5,000 Units by mouth daily.       Contact information for follow-up providers    [facility] Follow up in 4 week(s).   Specialty:  Neurology Contact information: [address] [place] [address] [phone]           Contact information for after-discharge care    Destination    [facility] .   Service:  Skilled Nursing Contact information: [address] [place] [address] [phone]                 No  Known Allergies  Consultations:  Neurology  Palliative care   Procedures/Studies: Mr Shirlee Latch ZO Contrast  Result Date: 08/22/2018 CLINICAL DATA:  85 y/o F; left-sided weakness and altered mental status. Focal neuro deficit, > 6 hrs, stroke suspected. EXAM: MRI HEAD WITHOUT CONTRAST MRA HEAD WITHOUT CONTRAST TECHNIQUE: Multiplanar, multiecho pulse sequences of the brain and surrounding structures were obtained without intravenous contrast. Angiographic images of the head were obtained using MRA technique without contrast. COMPARISON:  08/21/2018 CT head FINDINGS: MRI HEAD FINDINGS Brain: Reduced diffusion in the right lateral parietal lobe with some extension into the right posterior temporal lobe and occipital lobe compatible with acute/early subacute infarction. Additional acute/early subacute infarctions are present in the right basal ganglia involving lentiform and caudate nuclei. Areas of stroke are T2 FLAIR hyperintense. No acute hemorrhage. No extra-axial collection, hydrocephalus, mass effect, or herniation. Punctate foci of susceptibility hypointensity in right temporal and left parietal lobe are compatible hemosiderin deposition of chronic microhemorrhage and in a nonspecific distribution.  Very small chronic infarcts are present within the bilateral cerebellar hemispheres, bilateral thalami, and the left lentiform nucleus. Large chronic nonspecific T2 FLAIR hyperintensities in subcortical and periventricular white matter are compatible with advanced chronic microvascular ischemic changes. Volume loss of the brain is stable and asymmetric in the anterior temporal lobes. Vascular: As below. Skull and upper cervical spine: Normal marrow signal. Sinuses/Orbits: Negative. Other: Right intra-ocular lens replacement. MRA HEAD FINDINGS Internal carotid arteries: Patent. Mild lumen irregularity of carotid siphons compatible with atherosclerosis with mild bilateral paraclinoid stenosis. Anterior cerebral arteries:  Patent. Middle cerebral arteries: Severe right M1 stenosis with diminished flow related signal in the right MCA distribution. Mild left M1 stenosis. Anterior communicating artery: Patent. Posterior communicating arteries:  Patent. Posterior cerebral arteries: Right P1 and tandem left P1/P2 segments of moderate to severe stenosis. Basilar artery:  Patent. Vertebral arteries:  Patent. Motion artifact at level of circle-of-Willis, suboptimal assessment for small aneurysm or subtle stenosis. No large vessel occlusion identified. IMPRESSION: 1. Acute/early subacute infarction involving the right lateral parietal lobe with extension into the right temporal and occipital lobes as well as the right lentiform and caudate nuclei. No hemorrhage or mass effect. 2. Multiple small chronic infarcts are present within the cerebellum and basal ganglia. 3. Advanced chronic microvascular ischemic changes and volume loss of the brain. 4. Asymmetric volume loss of anterior temporal lobes may represent neurodegenerative disease such as Alzheimer's or frontotemporal lobar degeneration. 5. Severe right M1 stenosis with asymmetric diminished flow related signal in the right MCA distribution. 6. Tandem segments of moderate to  severe stenosis in bilateral proximal PCA. 7. No intracranial large vessel occlusion identified. These results will be called to the ordering clinician or representative by the Radiologist Assistant, and communication documented in the PACS or zVision Dashboard. Electronically Signed   By: Mitzi Hansen M.D.   On: 08/22/2018 05:14   Mr Brain Wo Contrast  Result Date: 08/22/2018 CLINICAL DATA:  85 y/o F; left-sided weakness and altered mental status. Focal neuro deficit, > 6 hrs, stroke suspected. EXAM: MRI HEAD WITHOUT CONTRAST MRA HEAD WITHOUT CONTRAST TECHNIQUE: Multiplanar, multiecho pulse sequences of the brain and surrounding structures were obtained without intravenous contrast. Angiographic images of the head were obtained using MRA technique without contrast. COMPARISON:  08/21/2018 CT head FINDINGS: MRI HEAD FINDINGS Brain: Reduced diffusion in the right lateral parietal lobe with some extension into the right posterior temporal lobe and occipital lobe compatible with acute/early subacute infarction. Additional acute/early subacute infarctions are present  in the right basal ganglia involving lentiform and caudate nuclei. Areas of stroke are T2 FLAIR hyperintense. No acute hemorrhage. No extra-axial collection, hydrocephalus, mass effect, or herniation. Punctate foci of susceptibility hypointensity in right temporal and left parietal lobe are compatible hemosiderin deposition of chronic microhemorrhage and in a nonspecific distribution. Very small chronic infarcts are present within the bilateral cerebellar hemispheres, bilateral thalami, and the left lentiform nucleus. Large chronic nonspecific T2 FLAIR hyperintensities in subcortical and periventricular white matter are compatible with advanced chronic microvascular ischemic changes. Volume loss of the brain is stable and asymmetric in the anterior temporal lobes. Vascular: As below. Skull and upper cervical spine: Normal marrow signal.  Sinuses/Orbits: Negative. Other: Right intra-ocular lens replacement. MRA HEAD FINDINGS Internal carotid arteries: Patent. Mild lumen irregularity of carotid siphons compatible with atherosclerosis with mild bilateral paraclinoid stenosis. Anterior cerebral arteries:  Patent. Middle cerebral arteries: Severe right M1 stenosis with diminished flow related signal in the right MCA distribution. Mild left M1 stenosis. Anterior communicating artery: Patent. Posterior communicating arteries:  Patent. Posterior cerebral arteries: Right P1 and tandem left P1/P2 segments of moderate to severe stenosis. Basilar artery:  Patent. Vertebral arteries:  Patent. Motion artifact at level of circle-of-Willis, suboptimal assessment for small aneurysm or subtle stenosis. No large vessel occlusion identified. IMPRESSION: 1. Acute/early subacute infarction involving the right lateral parietal lobe with extension into the right temporal and occipital lobes as well as the right lentiform and caudate nuclei. No hemorrhage or mass effect. 2. Multiple small chronic infarcts are present within the cerebellum and basal ganglia. 3. Advanced chronic microvascular ischemic changes and volume loss of the brain. 4. Asymmetric volume loss of anterior temporal lobes may represent neurodegenerative disease such as Alzheimer's or frontotemporal lobar degeneration. 5. Severe right M1 stenosis with asymmetric diminished flow related signal in the right MCA distribution. 6. Tandem segments of moderate to severe stenosis in bilateral proximal PCA. 7. No intracranial large vessel occlusion identified. These results will be called to the ordering clinician or representative by the Radiologist Assistant, and communication documented in the PACS or zVision Dashboard. Electronically Signed   By: Mitzi Hansen M.D.   On: 08/22/2018 05:14   Dg Chest Port 1 View  Result Date: 08/21/2018 CLINICAL DATA:  Left-sided weakness.  Altered mental status  EXAM: PORTABLE CHEST 1 VIEW COMPARISON:  03/23/2013 FINDINGS: Cardiomegaly with vascular congestion. Interstitial prominence could reflect early interstitial edema. No confluent opacities or effusions. No acute bony abnormality. IMPRESSION: Cardiomegaly with vascular congestion and possible early interstitial edema. Electronically Signed   By: Charlett Nose M.D.   On: 08/21/2018 20:56   Dg Swallowing Func-speech Pathology  Result Date: 08/22/2018 Objective Swallowing Evaluation: Type of Study: MBS-Modified Barium Swallow Study  Patient Details Name: BITHA FAUTEUX MRN: [id number] Date of Birth: 16-May-1925 Today's Date: 08/22/2018 Time: SLP Start Time (ACUTE ONLY): 1345 -SLP Stop Time (ACUTE ONLY): 1404 SLP Time Calculation (min) (ACUTE ONLY): 19 min Past Medical History: Past Medical History: Diagnosis Date . Anxiety and depression  . Chronic pain  . COPD (chronic obstructive pulmonary disease) (HCC)  . Dementia (HCC)  . Essential hypertension  . GERD (gastroesophageal reflux disease)  . History of GI bleed 2011 . Left leg cellulitis  . Osteoarthritis  . Osteoporosis with fracture  Past Surgical History: Past Surgical History: Procedure Laterality Date . CHOLECYSTECTOMY   . FEMUR IM NAIL Left 03/21/2013  Procedure: INTRAMEDULLARY (IM) NAIL FEMORAL;  Surgeon: Verlee Rossetti, MD;  Location: [facility] OR;  Service: Orthopedics;  Laterality: Left; HPI: [name] with medical history significant of anxiety and depression, chronic pain, COPD, vascular dementia, essential hypertension, GERD, history of GI bleed, history of left lower extremity cellulitis, osteoarthritis, history of osteoporosis and left femoral fracture who was sent from [facility] nursing home for evaluation of altered mental status with new left-sided weakness.  The MRI of 08/22/18 revealed acute/early subacute infarction involving the right lateral parietal lobe with extension into the right temporal and occipital lobes as well as the right  lentiform and caudate nuclei. Pt's nurse from [facility] reported that the pt was on mechanical soft (ground) and thin liquids and she was being seen by speech pathology.  No data recorded Assessment / Plan / Recommendation CHL IP CLINICAL IMPRESSIONS 08/22/2018 Clinical Impression Pt presents with moderate-severe oropharyngeal dysphagia. The oral phase of the swallow was significant for oral holding and impaired A-P transport. Pharyngeally, she demonstrated a pharyngeal delay which resulted in penetration of thin liquids before and during deglutition. Penetration was to the level of vocal folds and resulted in eventual aspiration. Instances of aspiration resulted in a delayed cough which did not fully expell the aspirant. No instances of penetration/aspiration were noted with nectar thick liquids. Trace vallecular and pyriform sinus residue was noted but this is within normal limits for the pt's age. A dysphagia 1 (puree) diet with nectar thick liquids is recommended at this time and SLP will follow to assess tolerance of the current diet.  SLP Visit Diagnosis Dysphagia, oropharyngeal phase (R13.12) Attention and concentration deficit following -- Frontal lobe and executive function deficit following -- Impact on safety and function Moderate aspiration risk   CHL IP TREATMENT RECOMMENDATION 08/22/2018 Treatment Recommendations Therapy as outlined in treatment plan below   Prognosis 08/22/2018 Prognosis for Safe Diet Advancement Fair Barriers to Reach Goals Cognitive deficits Barriers/Prognosis Comment -- CHL IP DIET RECOMMENDATION 08/22/2018 SLP Diet Recommendations Dysphagia 1 (Puree) solids;Nectar thick liquid Liquid Administration via Cup;Straw Medication Administration Crushed with puree Compensations Slow rate;Small sips/bites Postural Changes Remain semi-upright after after feeds/meals (Comment);Seated upright at 90 degrees   CHL IP OTHER RECOMMENDATIONS 08/22/2018 Recommended Consults -- Oral Care  Recommendations -- Other Recommendations Order thickener from pharmacy   CHL IP FOLLOW UP RECOMMENDATIONS 08/22/2018 Follow up Recommendations Skilled Nursing facility   [facility] IP FREQUENCY AND DURATION 08/22/2018 Speech Therapy Frequency (ACUTE ONLY) min 2x/week Treatment Duration 2 weeks      CHL IP ORAL PHASE 08/22/2018 Oral Phase Impaired Oral - Pudding Teaspoon -- Oral - Pudding Cup -- Oral - Honey Teaspoon -- Oral - Honey Cup -- Oral - Nectar Teaspoon -- Oral - Nectar Cup -- Oral - Nectar Straw Delayed oral transit;Holding of bolus Oral - Thin Teaspoon -- Oral - Thin Cup -- Oral - Thin Straw Delayed oral transit;Holding of bolus Oral - Puree Delayed oral transit;Holding of bolus Oral - Mech Soft -- Oral - Regular -- Oral - Multi-Consistency -- Oral - Pill -- Oral Phase - Comment --  CHL IP PHARYNGEAL PHASE 08/22/2018 Pharyngeal Phase Impaired Pharyngeal- Pudding Teaspoon -- Pharyngeal -- Pharyngeal- Pudding Cup -- Pharyngeal -- Pharyngeal- Honey Teaspoon -- Pharyngeal -- Pharyngeal- Honey Cup -- Pharyngeal -- Pharyngeal- Nectar Teaspoon -- Pharyngeal -- Pharyngeal- Nectar Cup -- Pharyngeal -- Pharyngeal- Nectar Straw Delayed swallow initiation-pyriform sinuses;Delayed swallow initiation-vallecula Pharyngeal -- Pharyngeal- Thin Teaspoon -- Pharyngeal -- Pharyngeal- Thin Cup -- Pharyngeal -- Pharyngeal- Thin Straw Delayed swallow initiation-pyriform sinuses;Penetration/Aspiration before swallow;Penetration/Aspiration during swallow Pharyngeal Material enters airway, CONTACTS cords and  not ejected out;Material enters airway, passes BELOW cords and not ejected out despite cough attempt by patient Pharyngeal- Puree -- Pharyngeal -- Pharyngeal- Mechanical Soft -- Pharyngeal -- Pharyngeal- Regular -- Pharyngeal -- Pharyngeal- Multi-consistency -- Pharyngeal -- Pharyngeal- Pill -- Pharyngeal -- Pharyngeal Comment --  CHL IP CERVICAL ESOPHAGEAL PHASE 08/22/2018 Cervical Esophageal Phase WFL Pudding Teaspoon -- Pudding Cup --  Honey Teaspoon -- Honey Cup -- Nectar Teaspoon -- Nectar Cup -- Nectar Straw -- Thin Teaspoon -- Thin Cup -- Thin Straw -- Puree -- Mechanical Soft -- Regular -- Multi-consistency -- Pill -- Cervical Esophageal Comment -- Shanika I. Vear Clock, MS, CCC-SLP Acute Rehabilitation Services Office number [phone] Pager [phone] [name] 08/22/2018, 3:11 PM              Ct Head Code Stroke Wo Contrast  Result Date: 08/21/2018 CLINICAL DATA:  Code stroke. Found down. LEFT-sided weakness and LEFT facial droop. History of hypertension and dementia. EXAM: CT HEAD WITHOUT CONTRAST TECHNIQUE: Contiguous axial images were obtained from the base of the skull through the vertex without intravenous contrast. COMPARISON:  CT HEAD March 20, 2013 FINDINGS: BRAIN: New RIGHT thalamus and RIGHT basal ganglia infarcts. Old LEFT basal ganglia and thalamus infarcts. Small area LEFT occipital lobe encephalomalacia is new. Old small LEFT cerebellar infarct. No intraparenchymal hemorrhage, mass effect nor midline shift. Moderate to severe parenchymal brain volume loss with disproportion mesial temporal lobe atrophy. No hydrocephalus. Patchy supratentorial white matter hypodensities within normal range for patient's age, though non-specific are most compatible with chronic small vessel ischemic disease. No acute large vascular territory infarcts. No abnormal extra-axial fluid collections. Basal cisterns are patent. VASCULAR: Moderate calcific atherosclerosis of the carotid siphons. SKULL: No skull fracture. No significant scalp soft tissue swelling. SINUSES/ORBITS: Paranasal sinuses are well aerated. Mastoid air cells are well aerated.The included ocular globes and orbital contents are non-suspicious. Status post RIGHT ocular lens implant. OTHER: None. ASPECTS ([place] Stroke Program Early CT Score) - Ganglionic level infarction (caudate, lentiform nuclei, internal capsule, insula, M1-M3 cortex): 6 - Supraganglionic  infarction (M4-M6 cortex): 3 Total score (0-10 with 10 being normal): 9 IMPRESSION: 1. New age indeterminate RIGHT basal ganglia and RIGHT thalamus infarcts. 2. ASPECTS is 9. 3. Old LEFT basal ganglia and thalamus infarcts. New chronic appearing LEFT occipital lobe/PCA territory infarct. Old small LEFT cerebellar infarct. 4. Advanced temporal lobe atrophy seen with neuro degenerative syndromes. 5. Critical Value/emergent results were called by telephone at the time of interpretation on 08/21/2018 at 5:02 pm to Dr. Adriana Simas , who verbally acknowledged these results. Electronically Signed   By: Awilda Metro M.D.   On: 08/21/2018 17:02   Vas US Carotid  Result Date: 08/23/2018 Carotid Arterial Duplex Study Indications: CVA. Limitations: Patient cannot turn head/ high bifurcation Performing Technologist: Jeb Levering RDMS, RVT  Examination Guidelines: A complete evaluation includes B-mode imaging, spectral Doppler, color Doppler, and power Doppler as needed of all accessible portions of each vessel. Bilateral testing is considered an integral part of a complete examination. Limited examinations for reoccurring indications may be performed as noted.  Right Carotid Findings: +----------+--------+--------+--------+------------+------------------+           PSV cm/sEDV cm/sStenosisDescribe    Comments           +----------+--------+--------+--------+------------+------------------+ CCA Prox  60      16              heterogenous                   +----------+--------+--------+--------+------------+------------------+  CCA Distal53      17                                             +----------+--------+--------+--------+------------+------------------+ ICA Prox  64      20      1-39%               intimal thickening +----------+--------+--------+--------+------------+------------------+ ICA Distal72      20                                              +----------+--------+--------+--------+------------+------------------+ ECA       135     13                                             +----------+--------+--------+--------+------------+------------------+ +----------+--------+-------+--------------+-------------------+           PSV cm/sEDV cmsDescribe      Arm Pressure (mmHG) +----------+--------+-------+--------------+-------------------+ Subclavian               Not identified                    +----------+--------+-------+--------------+-------------------+ +---------+--------+--+--------+--+---------+ VertebralPSV cm/s52EDV cm/s17Antegrade +---------+--------+--+--------+--+---------+  Left Carotid Findings: +----------+--------+--------+--------+----------+--------+           PSV cm/sEDV cm/sStenosisDescribe  Comments +----------+--------+--------+--------+----------+--------+ CCA Prox  85      16                                 +----------+--------+--------+--------+----------+--------+ CCA Distal70      22                                 +----------+--------+--------+--------+----------+--------+ ICA Prox  62      16      1-39%   hypoechoictortuous +----------+--------+--------+--------+----------+--------+ ICA Distal63      19                                 +----------+--------+--------+--------+----------+--------+ ECA       47      5                                  +----------+--------+--------+--------+----------+--------+ +----------+--------+--------+--------------+-------------------+ SubclavianPSV cm/sEDV cm/sDescribe      Arm Pressure (mmHG) +----------+--------+--------+--------------+-------------------+                           Not identified                    +----------+--------+--------+--------------+-------------------+ +---------+--------+--+--------+--+---------+ VertebralPSV cm/s57EDV cm/s18Antegrade  +---------+--------+--+--------+--+---------+  Summary: Right Carotid: Velocities in the right ICA are consistent with a 1-39% stenosis. Left Carotid: Velocities in the left ICA are consistent with a 1-39% stenosis. Vertebrals:  Bilateral vertebral arteries demonstrate antegrade flow. Subclavians: Bilateral subclavian arteries were not visualized. *See table(s) above for measurements and observations.  Electronically signed by Delia Heady  MD on 08/23/2018 at 8:22:36 AM.    Final     Echo 08/22/2018  1. The left ventricle has normal systolic function, with an ejection fraction of 55-60%. The cavity size was normal. There is mildly increased left ventricular wall thickness. Left ventricular diastolic Doppler parameters are consistent with impaired  relaxation. 2. The right ventricle has normal systolic function. The cavity was normal. There is no increase in right ventricular wall thickness. 3. The mitral valve is normal in structure. 4. The tricuspid valve is normal in structure. 5. The aortic valve is normal in structure. no stenosis of the aortic valve. 6. The aortic root and ascending aorta are normal in size and structure.  7. The interatrial septum was not well visualized.  Carotid Dopplers insignificant stenosis bilateral carotids, antegrade flow in bilateral vertebrals  EEG 08/22/2018 This is an abnormal EEG secondary to general background slowing.  This finding may be seen with a diffuse disturbance that is etiologically nonspecific, but may include a metabolic encephalopathy, among other possibilities.  No epileptiform activity was noted.     Subjective:   Discharge Exam: Vitals:   08/24/18 0853 08/24/18 1155  BP:  135/62  Pulse:  (!) 104  Resp:  15  Temp:  98.4 F (36.9 C)  SpO2: 99% 93%   Vitals:   08/24/18 0809 08/24/18 0852 08/24/18 0853 08/24/18 1155  BP: (!) 161/81   135/62  Pulse: 90 80  (!) 104  Resp: 19 18  15   Temp: 98.1 F (36.7 C)   98.4 F (36.9 C)   TempSrc: Axillary   Axillary  SpO2: 97% 99% 99% 93%  Weight:      Height:   5\' 8"  (1.727 m)     General exam: Appears calm and comfortable  Respiratory system: No increased work of breathing, diminished anterior lung sounds, no wheezes crackles, rhonchi. Cardiovascular system: Distant heart sounds, regular rate and rhythm, no murmurs Gastrointestinal system: Soft, nontender, no rebound or guarding, plus bowel sounds. Central nervous system: Alert and oriented only to self and possibly to place but not to time or situation.  Dense left-sided hemiparesis with left upper, left lower and facial left-sided weakness, left upper extremity weakness improved Extremities: Trace lower extremity edema Skin: No rashe over visible skin Psychiatry: Unable to assess due to underlying dementia     The results of significant diagnostics from this hospitalization (including imaging, microbiology, ancillary and laboratory) are listed below for reference.     Microbiology: Recent Results (from the past 240 hour(s))  MRSA PCR Screening     Status: None   Collection Time: 08/21/18 11:37 PM  Result Value Ref Range Status   MRSA by PCR NEGATIVE NEGATIVE Final    Comment:        The GeneXpert MRSA Assay (FDA approved for NASAL specimens only), is one component of a comprehensive MRSA colonization surveillance program. It is not intended to diagnose MRSA infection nor to guide or monitor treatment for MRSA infections. Performed at [facility], [address], [place], [place] [address]      Labs: BNP (last 3 results) No results for input(s): BNP in the last 8760 hours. Basic Metabolic Panel: Recent Labs  Lab 08/21/18 1700 08/23/18 0539 08/24/18 0429  NA 144 145 146*  K 3.5 3.3* 3.3*  CL 106 109 108  CO2 27 24 24   GLUCOSE 108* 92 100*  BUN 16 17 17   CREATININE 0.78 0.67 0.71  CALCIUM 8.7* 8.9 9.2  MG  2.2  --   --   PHOS 4.6  --   --    Liver Function Tests: Recent Labs   Lab 08/21/18 1700  AST 23  ALT 19  ALKPHOS 32*  BILITOT 1.0  PROT 6.9  ALBUMIN 3.9   No results for input(s): LIPASE, AMYLASE in the last 168 hours. No results for input(s): AMMONIA in the last 168 hours. CBC: Recent Labs  Lab 08/21/18 1700 08/23/18 0539  WBC 9.8 8.4  NEUTROABS 5.9  --   HGB 13.3 13.0  HCT 43.6 41.5  MCV 97.8 96.5  PLT 237 203   Cardiac Enzymes: Recent Labs  Lab 08/22/18 0102 08/22/18 0658  TROPONINI 0.03* 0.03*   BNP: Invalid input(s): POCBNP CBG: Recent Labs  Lab 08/23/18 0558 08/23/18 1123 08/23/18 2343 08/24/18 0623 08/24/18 1138  GLUCAP 81 155* 99 104* 121*   D-Dimer No results for input(s): DDIMER in the last 72 hours. Hgb A1c Recent Labs    08/23/18 0539  HGBA1C 5.0   Lipid Profile Recent Labs    08/23/18 0539  CHOL 239*  HDL 38*  LDLCALC 173*  TRIG 138  CHOLHDL 6.3   Thyroid function studies Recent Labs    08/22/18 0102  TSH 0.635   Anemia work up Recent Labs    08/22/18 0102  VITAMINB12 372   Urinalysis    Component Value Date/Time   COLORURINE YELLOW 02/28/2010 1850   APPEARANCEUR HAZY (A) 02/28/2010 1850   LABSPEC >1.030 (H) 02/28/2010 1850   PHURINE 5.0 02/28/2010 1850   GLUCOSEU NEGATIVE 02/28/2010 1850   HGBUR LARGE (A) 02/28/2010 1850   BILIRUBINUR SMALL (A) 02/28/2010 1850   KETONESUR TRACE (A) 02/28/2010 1850   PROTEINUR 30 (A) 02/28/2010 1850   UROBILINOGEN 0.2 02/28/2010 1850   NITRITE POSITIVE (A) 02/28/2010 1850   LEUKOCYTESUR NEGATIVE 02/28/2010 1850   Sepsis Labs Invalid input(s): PROCALCITONIN,  WBC,  LACTICIDVEN Microbiology Recent Results (from the past 240 hour(s))  MRSA PCR Screening     Status: None   Collection Time: 08/21/18 11:37 PM  Result Value Ref Range Status   MRSA by PCR NEGATIVE NEGATIVE Final    Comment:        The GeneXpert MRSA Assay (FDA approved for NASAL specimens only), is one component of a comprehensive MRSA colonization surveillance program. It is  not intended to diagnose MRSA infection nor to guide or monitor treatment for MRSA infections. Performed at [facility], [address], [place], [place] [address]      Time coordinating discharge: 35  SIGNED:   Delaine Lame, MD  [facility] 08/24/2018, 2:52 PM  If 7PM-7AM, please contact night-coverage [email or web address] Password [email or web address]

## 2018-08-24 NOTE — Progress Notes (Signed)
Report given to [place] at [facility]..  Patient will be discharging this afternoon.

## 2018-08-24 NOTE — Progress Notes (Signed)
Patient discharged with PTAR  

## 2018-08-24 NOTE — Progress Notes (Signed)
  Speech Language Pathology Treatment: Dysphagia  Patient Details Name: Lisa Watts MRN: [id number] DOB: 19-Nov-1924 Today's Date: 08/24/2018 Time: 1100-1130 SLP Time Calculation (min) (ACUTE ONLY): 30 min  Assessment / Plan / Recommendation Clinical Impression  Pt was seen for dysphagia treatment to assess continued tolerance of the recommended diet. Moderate lingual residue was noted which appeared similar to mashed potatoes. Extensive oral care was provided by this SLP with moderate cues for pt to demonstrate/maintain an open-mouth posture. Pt tolerated puree solids and nectar thick liquids via straw without overt s/sx of aspiration but required increased cueing to swallow puree boluses. Length of bolus holding and bolus manipulation prior to deglutition of puree was notably more pronounced compared to that which was observed yesterday. A single liquid wash was effective in reducing oral residue but multiple liquid boluses were needed to eliminate residue. The oral phase of her swallow appears somewhat worse than what was demonstrated on 08/23/18. SLP will continue to follow to determine need for further intervention.    HPI HPI: [name] with medical history significant of anxiety and depression, chronic pain, COPD, vascular dementia, essential hypertension, GERD, history of GI bleed, history of left lower extremity cellulitis, osteoarthritis, history of osteoporosis and left femoral fracture who was sent from [facility] nursing home for evaluation of altered mental status with new left-sided weakness.  The MRI of 08/22/18 revealed acute/early subacute infarction involving the right lateral parietal lobe with extension into the right temporal and occipital lobes as well as the right lentiform and caudate nuclei. Pt's nurse from [facility] reported that the pt was on mechanical soft (ground) and thin liquids and she was being seen by speech pathology.       SLP Plan  Continue  with current plan of care       Recommendations  Diet recommendations: Dysphagia 1 (puree);Nectar-thick liquid Liquids provided via: Straw;Cup Medication Administration: Crushed with puree Supervision: Full supervision/cueing for compensatory strategies Compensations: Slow rate;Small sips/bites;Follow solids with liquid;Multiple dry swallows after each bite/sip Postural Changes and/or Swallow Maneuvers: Seated upright 90 degrees;Upright 30-60 min after meal                Oral Care Recommendations: Oral care BID;Staff/trained caregiver to provide oral care Follow up Recommendations: Skilled Nursing facility SLP Visit Diagnosis: Dysphagia, oropharyngeal phase (R13.12) Plan: Continue with current plan of care       Deania Siguenza I. Vear Clock, MS, CCC-SLP Acute Rehabilitation Services Office number [phone] Pager [phone]               [name] 08/24/2018, 1:43 PM

## 2018-08-24 NOTE — Progress Notes (Signed)
CCMD reported 7 beats run of VTach. Also had 12 beats of wide QRS. Pt asymptomatic. Denies SOB and chest pain.  NP notified with no new order.

## 2018-08-24 NOTE — Care Management Important Message (Signed)
Important Message  Patient Details  Name: MAILEN KASER MRN: [id number] Date of Birth: 19-Dec-1924   Medicare Important Message Given:  Yes Due to illness patient was not able to sign.  Unsigned copy left    Romin Divita 08/24/2018, 3:42 PM

## 2018-08-24 NOTE — NC FL2 (Signed)
[place] MEDICAID FL2 LEVEL OF CARE SCREENING TOOL     IDENTIFICATION  Patient Name: Lisa Watts Birthdate: 1924/08/01 Sex: female Admission Date (Current Location): 08/21/2018  [facility] and [place] Number:  [employer] and Address:  [facility] [place], [address] [place], [place] [address]      Provider Number: [id number]  Attending Physician Name and Address:  Delaine Lame, MD  Relative Name and Phone Number:       Current Level of Care: Hospital Recommended Level of Care: Skilled Nursing Facility Prior Approval Number:    Date Approved/Denied:   PASRR Number:    Discharge Plan: SNF    Current Diagnoses: Patient Active Problem List   Diagnosis Date Noted  . Goals of care, counseling/discussion   . Palliative care by specialist   . DNR (do not resuscitate) discussion   . Abnormal EKG 08/22/2018  . Cerebrovascular accident (CVA) (HCC) 08/21/2018  . COPD (chronic obstructive pulmonary disease) (HCC) 08/21/2018  . GERD (gastroesophageal reflux disease) 08/21/2018  . Essential hypertension 08/21/2018  . Anxiety and depression 08/21/2018  . Dementia (HCC) 08/21/2018  . Subtrochanteric fracture of left femur (HCC) 03/21/2013  . ANEMIA, IRON DEFICIENCY 01/02/2010    Orientation RESPIRATION BLADDER Height & Weight     Self  Normal Incontinent Weight: 136 lb 3.9 oz (61.8 kg) Height:  5\' 8"  (172.7 cm)  BEHAVIORAL SYMPTOMS/MOOD NEUROLOGICAL BOWEL NUTRITION STATUS      Incontinent Diet(see DC summary)  AMBULATORY STATUS COMMUNICATION OF NEEDS Skin   Total Care Verbally Normal                       Personal Care Assistance Level of Assistance  Bathing, Feeding, Dressing, Total care Bathing Assistance: Maximum assistance Feeding assistance: Maximum assistance Dressing Assistance: Maximum assistance Total Care Assistance: Maximum assistance   Functional Limitations Info  Sight, Hearing, Speech Sight Info:  Adequate Hearing Info: Adequate Speech Info: Adequate    SPECIAL CARE FACTORS FREQUENCY                       Contractures Contractures Info: Not present    Additional Factors Info  Code Status, Allergies, Psychotropic Code Status Info: DNR Allergies Info: NKA Psychotropic Info: Buspar 10mg  2x/day         Current Medications (08/24/2018):  This is the current hospital active medication list Current Facility-Administered Medications  Medication Dose Route Frequency Provider Last Rate Last Dose  . acetaminophen (TYLENOL) tablet 650 mg  650 mg Oral Q4H PRN Bobette Mo, MD       Or  . acetaminophen (TYLENOL) solution 650 mg  650 mg Per Tube Q4H PRN Bobette Mo, MD       Or  . acetaminophen (TYLENOL) suppository 650 mg  650 mg Rectal Q4H PRN Bobette Mo, MD      . aspirin suppository 300 mg  300 mg Rectal Daily Bobette Mo, MD   300 mg at 08/22/18 1008   Or  . aspirin tablet 325 mg  325 mg Oral Daily Bobette Mo, MD   325 mg at 08/23/18 1018  . atorvastatin (LIPITOR) tablet 40 mg  40 mg Oral q1800 Layne Benton, NP   40 mg at 08/23/18 1849  . busPIRone (BUSPAR) tablet 10 mg  10 mg Oral BID Purohit, Shrey C, MD   10 mg at 08/23/18 2140  . calcium carbonate (TUMS - dosed  in mg elemental calcium) chewable tablet 200 mg of elemental calcium  1 tablet Oral TID Purohit, Shrey C, MD   200 mg of elemental calcium at 08/23/18 2142  . chlorhexidine (PERIDEX) 0.12 % solution 15 mL  15 mL Mouth Rinse BID Purohit, Shrey C, MD   15 mL at 08/23/18 2141  . clopidogrel (PLAVIX) tablet 75 mg  75 mg Oral Daily Purohit, Shrey C, MD   75 mg at 08/23/18 1018  . enoxaparin (LOVENOX) injection 30 mg  30 mg Subcutaneous Q24H Bobette Mo, MD   30 mg at 08/23/18 2140  . feeding supplement (BOOST / RESOURCE BREEZE) liquid 1 Container  237 mL Oral TID Delaine Lame, MD   1 Container at 08/23/18 2141  . furosemide (LASIX) tablet 40 mg  40 mg Oral Daily  Purohit, Shrey C, MD   40 mg at 08/23/18 1025  . ipratropium-albuterol (DUONEB) 0.5-2.5 (3) MG/3ML nebulizer solution 3 mL  3 mL Nebulization Q6H PRN Purohit, Shrey C, MD      . loperamide (IMODIUM) capsule 2 mg  2 mg Oral QID PRN Purohit, Shrey C, MD      . MEDLINE mouth rinse  15 mL Mouth Rinse q12n4p Purohit, Shrey C, MD   15 mL at 08/23/18 1849  . metoprolol tartrate (LOPRESSOR) tablet 12.5 mg  12.5 mg Oral BID Purohit, Shrey C, MD   12.5 mg at 08/23/18 2141  . mometasone-formoterol (DULERA) 200-5 MCG/ACT inhaler 2 puff  2 puff Inhalation BID Purohit, Salli Quarry, MD   2 puff at 08/24/18 0849  . multivitamin with minerals tablet 1 tablet  1 tablet Oral Daily Purohit, Salli Quarry, MD   1 tablet at 08/23/18 1018  . ondansetron (ZOFRAN) tablet 4 mg  4 mg Oral Q6H PRN Bobette Mo, MD       Or  . ondansetron [facility]) injection 4 mg  4 mg Intravenous Q6H PRN Bobette Mo, MD      . potassium chloride SA (K-DUR,KLOR-CON) CR tablet 40 mEq  40 mEq Oral Once Purohit, Salli Quarry, MD      . RESOURCE THICKENUP CLEAR   Oral PRN Purohit, Salli Quarry, MD      . umeclidinium bromide (INCRUSE ELLIPTA) 62.5 MCG/INH 1 puff  1 puff Inhalation Daily Purohit, Salli Quarry, MD   1 puff at 08/24/18 0277     Discharge Medications: Please see discharge summary for a list of discharge medications.  Relevant Imaging Results:  Relevant Lab Results:   Additional Information SS#: [id number]; will need hospice set up at facility  [facility], LCSW

## 2018-08-24 NOTE — Discharge Instructions (Signed)
Ischemic Stroke    An ischemic stroke is the sudden death of brain tissue. Blood carries oxygen to all areas of the body. This type of stroke happens when your blood does not flow to your brain like normal. Your brain cannot get the oxygen it needs. This is an emergency. It must be treated right away.  Symptoms of a stroke usually happen all of a sudden. You may notice them when you wake up. They can include:  · Weakness or loss of feeling in your face, arm, or leg. This often happens on one side of the body.  · Trouble walking.  · Trouble moving your arms or legs.  · Loss of balance or coordination.  · Feeling confused.  · Trouble talking or understanding what people are saying.  · Slurred speech.  · Trouble seeing.  · Seeing two of one object (double vision).  · Feeling dizzy.  · Feeling sick to your stomach (nauseous) and throwing up (vomiting).  · A very bad headache for no reason.  Get help as soon as any of these problems start. This is important. Some treatments work better if they are given right away. These include:  · Aspirin.  · Medicines to control blood pressure.  · A shot (injection) of medicine to break up the blood clot.  · Treatments given in the blood vessel (artery) to take out the clot or break it up.  Other treatments may include:  · Oxygen.  · Fluids given through an IV tube.  · Medicines to thin out your blood.  · Procedures to help your blood flow better.  What increases the risk?  Certain things may make you more likely to have a stroke. Some of these are things that you can change, such as:  · Being very overweight (obesity).  · Smoking.  · Taking birth control pills.  · Not being active.  · Drinking too much alcohol.  · Using drugs.  Other risk factors include:  · High blood pressure.  · High cholesterol.  · Diabetes.  · Heart disease.  · Being African American, Native American, Hispanic, or [place] Native.  · Being over age 60.  · Family history of stroke.  · Having had blood clots,  stroke, or warning stroke (transient ischemic attack, TIA) in the past.  · Sickle cell disease.  · Being a woman with a history of high blood pressure in pregnancy (preeclampsia).  · Migraine headache.  · Sleep apnea.  · Having an irregular heartbeat (atrial fibrillation).  · Long-term (chronic) diseases that cause soreness and swelling (inflammation).  · Disorders that affect how your blood clots.  Follow these instructions at home:  Medicines  · Take over-the-counter and prescription medicines only as told by your doctor.  · If you were told to take aspirin or another medicine to thin your blood, take it exactly as told by your doctor.  ? Taking too much of the medicine can cause bleeding.  ? If you do not take enough, it may not work as well.  · Know the side effects of your medicines. If you are taking a blood thinner, make sure you:  ? Hold pressure over any cuts for longer than usual.  ? Tell your dentist and other doctors that you take this medicine.  ? Avoid activities that may cause damage or injury to your body.  Eating and drinking  · Follow instructions from your doctor about what you cannot eat or drink.  · Eat   healthy foods.  · If you have trouble with swallowing, do these things to avoid choking:  ? Take small bites when eating.  ? Eat foods that are soft or pureed.  Safety  · Follow instructions from your health care team about physical activity.  · Use a walker or cane as told by your doctor.  · Keep your home safe so you do not fall. This may include:  ? Having experts look at your home to make sure it is safe.  ? Putting grab bars in the bedroom and bathroom.  ? Using raised toilets.  ? Putting a seat in the shower.  General instructions  · Do not use any tobacco products.  ? Examples of these are cigarettes, chewing tobacco, and e-cigarettes.  ? If you need help quitting, ask your doctor.  · Limit how much alcohol you drink. This means no more than 1 drink a day for nonpregnant women and 2 drinks  a day for men. One drink equals 12 oz of beer, 5 oz of wine, or 1½ oz of hard liquor.  · If you need help to stop using drugs or alcohol, ask your doctor to refer you to a program or specialist.  · Stay active. Exercise as told by your doctor.  · Keep all follow-up visits as told by your doctor. This is important.  Get help right away if:    · You have any signs of a stroke. "BE FAST" is an easy way to remember the main warning signs:  ? B - Balance. Signs are dizziness, sudden trouble walking, or loss of balance.  ? E - Eyes. Signs are trouble seeing or a change in how you see.  ? F - Face. Signs are sudden weakness or loss of feeling of the face, or the face or eyelid drooping on one side.  ? A - Arms. Signs are weakness or loss of feeling in an arm. This happens suddenly and usually on one side of the body.  ? S - Speech. Signs are sudden trouble speaking, slurred speech, or trouble understanding what people say.  ? T - Time. Time to call emergency services. Write down what time symptoms started.  · You have other signs of a stroke, such as:  ? A sudden, very bad headache with no known cause.  ? Feeling sick to your stomach (nausea).  ? Throwing up (vomiting).  ? Jerky movements you cannot control (seizure).  These symptoms may be an emergency. Do not wait to see if the symptoms will go away. Get medical help right away. Call your local emergency services (911 in the [place] Do not drive yourself to the hospital.  Summary  · An ischemic stroke is the sudden death of brain tissue.  · Symptoms of a stroke usually happen all of a sudden. You may notice them when you wake up.  · Get help if you have any warning signs of a stroke. This is important. Some treatments work better if they are given right away.  This information is not intended to replace advice given to you by your health care provider. Make sure you discuss any questions you have with your health care provider.  Document Released: 06/05/2011 Document  Revised: 11/25/2017 Document Reviewed: 09/12/2015  Elsevier Interactive Patient Education © 2019 [employer]

## 2018-08-24 NOTE — Progress Notes (Signed)
Discharge to: [facility] Anticipated discharge date: 08/24/18 Family notified: Yes, legal guardian by phone Transportation by: PTAR  Report #: [phone]  CSW signing off.  Blenda Nicely LCSW [phone]

## 2018-09-29 DEATH — deceased

## 2018-10-04 ENCOUNTER — Inpatient Hospital Stay: Payer: Self-pay | Admitting: Adult Health
